# Patient Record
Sex: Male | Born: 1960 | Race: White | Hispanic: No | Marital: Married | Smoking: Never smoker
Health system: Southern US, Community
[De-identification: ages and names within clinical notes are randomized; demographics above are authoritative.]

## PROBLEM LIST (undated history)

## (undated) DIAGNOSIS — D34 Benign neoplasm of thyroid gland: Secondary | ICD-10-CM

## (undated) DIAGNOSIS — R091 Pleurisy: Secondary | ICD-10-CM

## (undated) DIAGNOSIS — R079 Chest pain, unspecified: Secondary | ICD-10-CM

## (undated) DIAGNOSIS — M5136 Other intervertebral disc degeneration, lumbar region: Secondary | ICD-10-CM

## (undated) DIAGNOSIS — N529 Male erectile dysfunction, unspecified: Secondary | ICD-10-CM

## (undated) DIAGNOSIS — R61 Generalized hyperhidrosis: Secondary | ICD-10-CM

## (undated) DIAGNOSIS — I493 Ventricular premature depolarization: Secondary | ICD-10-CM

## (undated) DIAGNOSIS — E785 Hyperlipidemia, unspecified: Secondary | ICD-10-CM

## (undated) DIAGNOSIS — Z8249 Family history of ischemic heart disease and other diseases of the circulatory system: Secondary | ICD-10-CM

## (undated) DIAGNOSIS — J309 Allergic rhinitis, unspecified: Secondary | ICD-10-CM

## (undated) DIAGNOSIS — I10 Essential (primary) hypertension: Secondary | ICD-10-CM

## (undated) DIAGNOSIS — R002 Palpitations: Secondary | ICD-10-CM

## (undated) HISTORY — DX: Family history of ischemic heart disease and other diseases of the circulatory system: Z82.49

## (undated) HISTORY — PX: ROTATOR CUFF REPAIR: SHX139

## (undated) HISTORY — DX: Chest pain, unspecified: R07.9

## (undated) HISTORY — DX: Ventricular premature depolarization: I49.3

## (undated) HISTORY — DX: Male erectile dysfunction, unspecified: N52.9

## (undated) HISTORY — DX: Pleurisy: R09.1

## (undated) HISTORY — DX: Other intervertebral disc degeneration, lumbar region: M51.36

## (undated) HISTORY — DX: Hyperlipidemia, unspecified: E78.5

## (undated) HISTORY — DX: Allergic rhinitis, unspecified: J30.9

## (undated) HISTORY — DX: Benign neoplasm of thyroid gland: D34

## (undated) HISTORY — PX: THYROIDECTOMY, PARTIAL: SHX18

## (undated) HISTORY — PX: TESTICLE TORSION REDUCTION: SHX795

## (undated) HISTORY — DX: Essential (primary) hypertension: I10

## (undated) HISTORY — PX: ANKLE ARTHROSCOPY: SUR85

## (undated) HISTORY — DX: Palpitations: R00.2

## (undated) HISTORY — DX: Generalized hyperhidrosis: R61

---

## 2002-10-31 ENCOUNTER — Ambulatory Visit (HOSPITAL_BASED_OUTPATIENT_CLINIC_OR_DEPARTMENT_OTHER): Admission: RE | Admit: 2002-10-31 | Discharge: 2002-10-31 | Payer: Self-pay | Admitting: Orthopedic Surgery

## 2005-07-06 ENCOUNTER — Encounter: Admission: RE | Admit: 2005-07-06 | Discharge: 2005-07-06 | Payer: Self-pay | Admitting: Internal Medicine

## 2005-07-09 ENCOUNTER — Other Ambulatory Visit: Admission: RE | Admit: 2005-07-09 | Discharge: 2005-07-09 | Payer: Self-pay | Admitting: Interventional Radiology

## 2005-07-09 ENCOUNTER — Encounter (INDEPENDENT_AMBULATORY_CARE_PROVIDER_SITE_OTHER): Payer: Self-pay | Admitting: Specialist

## 2005-07-09 ENCOUNTER — Encounter: Admission: RE | Admit: 2005-07-09 | Discharge: 2005-07-09 | Payer: Self-pay | Admitting: Internal Medicine

## 2005-07-20 ENCOUNTER — Encounter (HOSPITAL_COMMUNITY): Admission: RE | Admit: 2005-07-20 | Discharge: 2005-09-16 | Payer: Self-pay | Admitting: Internal Medicine

## 2005-10-15 ENCOUNTER — Encounter (INDEPENDENT_AMBULATORY_CARE_PROVIDER_SITE_OTHER): Payer: Self-pay | Admitting: *Deleted

## 2005-10-15 ENCOUNTER — Ambulatory Visit (HOSPITAL_COMMUNITY): Admission: RE | Admit: 2005-10-15 | Discharge: 2005-10-16 | Payer: Self-pay | Admitting: Surgery

## 2007-06-21 ENCOUNTER — Ambulatory Visit (HOSPITAL_COMMUNITY): Admission: RE | Admit: 2007-06-21 | Discharge: 2007-06-21 | Payer: Self-pay | Admitting: Internal Medicine

## 2010-05-23 NOTE — Op Note (Signed)
NAME:  Randy Harrison, Randy Harrison NO.:  [id number]   MEDICAL RECORD NO.:  [id number]          PATIENT TYPE:  OIB   LOCATION:  5741                         FACILITY:  [facility]   PHYSICIAN:  Velora Heckler, MD      DATE OF BIRTH:  12/25/1960   DATE OF PROCEDURE:  10/15/2005  DATE OF DISCHARGE:  10/16/2005                                 OPERATIVE REPORT   PREOPERATIVE DIAGNOSIS:  Right thyroid nodule.   POSTOPERATIVE DIAGNOSIS:  Right thyroid nodule.   PROCEDURE:  Right thyroid lobectomy.   SURGEON:  Velora Heckler, MD, FACS   ASSISTANT:  Cyndia Bent, MD, FACS   ANESTHESIA:  General per Maren Beach, MD   ESTIMATED BLOOD LOSS:  Minimal.   PREPARATION:  Betadine.   COMPLICATIONS:  None.   INDICATIONS:  The patient is a 50 year old white male from [place], [place].  The patient was found on routine physical exam June2007 to have a  dominant right thyroid mass.  The TSH level was normal.  Nuclear scan showed  this to be a dominant cold nodule.  Ultrasound showed a 3.2 cm nodule in the  right thyroid lobe.  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  The patient was referred for resection.   BODY OF REPORT:  The procedure is done in OR #16 at the [facility].  The patient is brought to the operating room, placed in  a supine position on the operating room table.  Following administration of  general anesthesia, the patient is positioned and then prepped and draped in  the usual strict aseptic fashion.  After ascertaining that an adequate level  of anesthesia had been obtained, a Kocher incision was made with a #15  blade.  Dissection was carried through subcutaneous tissues and platysma.  Hemostasis was obtained with the electrocautery.  Skin flaps are elevated  cephalad and caudad from the thyroid notch to the sternal notch.  A Mahorner  self-retaining retractor is placed for exposure.  Strap muscles  are incised  in the midline.  The left thyroid lobe is exposed.  On palpation it is  grossly normal.  There are no dominant masses.  There is no lymphadenopathy.   Next we turned our attention to the right thyroid lobe.  Again strap muscles  are mobilized and reflected laterally.  Middle thyroid vein is divided  between Ligaclips.  There is a large mass in the central portion of the  right lobe which occupies a large portion of the lobe.  It appears complex  with both cystic and solid components.  Superior pole vessels are mobilized,  dissected out, ligated in continuity with 2-0 silk ties and medium  Ligaclips, and divided.  Gland is rolled anteriorly.  Superior parathyroid  gland is identified and preserved on its vascular pedicle.  Branches of the  inferior thyroid artery are divided between small Ligaclips.  Inferior  venous tributaries are divided between medium Ligaclips.  Gland is rolled  further anteriorly.  Venous tributaries to the isthmus are ligated in  continuity with 2-0 silk ties and divided.  Ligament of Allyson Sabal is transected  with the electrocautery and the gland is rolled anteriorly.  Inferior  parathyroid tissue is identified and preserved.  Gland is rolled up and onto  the trachea.  It is mobilized across the isthmus.  The isthmus is transected  to the left of midline between hemostats and suture ligated with 3-0 Vicryl  suture ligatures.  Right thyroid lobe is then submitted to pathology.  Berneta Levins, M.D., performed frozen section and believes this to be a  hyperplastic thyroid nodule.  She sees no evidence of malignancy on frozen  section.   Right neck is irrigated with warm saline.  Good hemostasis is achieved.  Surgicel is placed over the area of the recurrent laryngeal nerve and  parathyroid glands.  Strap muscles are reapproximated in the midline with  interrupted 3-0 Vicryl sutures.  Platysma is closed with interrupted 3-0  Vicryl sutures.  Skin is closed with  a running 4-0 Vicryl subcuticular  suture.  Wound is washed and dried and Benzoin and Steri-Strips are applied  and sterile dressings were applied.  The patient is awakened from anesthesia  and brought to the recovery room in stable condition.  The patient tolerated  the procedure well.      Velora Heckler, MD  Electronically Signed     TMG/MEDQ  D:  10/15/2005  T:  10/17/2005  Job:  [id number]   cc:   Theressa Millard, M.D.

## 2010-05-23 NOTE — Op Note (Signed)
NAME:  Randy Harrison, SEHGAL                          ACCOUNT NO.:  [id number]   MEDICAL RECORD NO.:  [id number]                   PATIENT TYPE:  AMB   LOCATION:  DSC                                  FACILITY:  [facility]   PHYSICIAN:  Deidre Ala, M.D.                 DATE OF BIRTH:  1960-08-19   DATE OF PROCEDURE:  10/31/2002  DATE OF DISCHARGE:                                 OPERATIVE REPORT   PREOPERATIVE DIAGNOSIS:  1. 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  2. Acromioclavicular joint arthritis.  3. Rule out partial thickness rotator cuff tear.   POSTOPERATIVE DIAGNOSIS:  1. Impingement with type 3 acromion.  2. Acromioclavicular joint arthritis.  3. Rotator cuff avulsion off tuberosity.  4. Subdeltoid bursitis.  5. Minor synovitis, anterior triangle.   OPERATION PERFORMED:  1. Left shoulder operative arthroscopy with subacromial arch decompression     acromioplasty.  2. Mini open rotator cuff repair with three Mitek anchors.  3. Arthroscopic distal clavicle resection.  4. Debridement synovitis, glenohumeral joint and subdeltoid bursectomy.   SURGEON:  Bradley Ferris, M.D.   ASSISTANT:  Madilyn Fireman, P.A.-C.   ANESTHESIA:  General endotracheal.   CULTURES:  None.   DRAINS:  None.   ESTIMATED BLOOD LOSS:  Minimal.   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  We have seen him now for acromioclavicular joint  arthritis and signs of impingement with overhead arm use from May 27, 2001,  approximately 17 or 18 months ago.  We have injected him in the  acromioclavicular joint, we injected subacromial.  He had a type 2 to 3  acromion.  I think it was actually a fairly sharp acromion and we injected  him again January 23, 2002.  We injected again February 13, 2002 and he  continued to have on and off discomfort and finally came back on October 27, 2002 with persistent pain and wanted to proceed with  surgical intervention.  We did not do an MRI scan.  At surgery, he had some anterior synovitis in  the triangle.  Biceps was intact.  Glenohumeral surfaces looked good.  There  was no significant fraying of the superior labrum and no SLAP lesion.  He  had obvious bursitis in the subacromial space with a fairly sharp anterior  acromion, obviously arthritic acromioclavicular joint with a torn  acromioclavicular meniscus and he did have a rotator cuff tear measuring  about 2 cm avulsed up from the tuberosity that we anchored down by three  super Mitek anchors in a V-shaped fashion with fresh bone with a watertight  closure with  #2 Ethibond sutures.   DESCRIPTION OF PROCEDURE:  With adequate anesthesia obtained using  endotracheal technique, 1g Ancef given IV prophylaxis, the patient was  placed in the supine beach chair position.  The left shoulder was prepped  and draped in standard fashion.  After standard prepping and draping, skin  markers were made for anatomic positioning.  20mL of 0.5% Marcaine with  epinephrine was injected in the subacromial space to open it up.  I then  entered the shoulder from a posterior portal.  Anterior portal was  established just lateral to the coracoid.  I then smoothed and shaved the  anterior triangle, reversed portals and did similar shaving superolateral.  I then entered the subacromial space with the posterior portal.  The  anterolateral portal was established and another one just a bit more  posterior later to achieve better angle.  I then debrided soft tissue from  the anterior undersurface of the acromion. I then used the ablator to  cauterize and shaver to smooth further and then brought in a 6.0 bur and  completed acromioplasty to the roof of the subacromial space in the manner  of Caspari.  I then turned the scope sideways and through the anterior  portal with a basket, debrided the acromioclavicular meniscus, further  shaved with a shaver.  I  then used the 6.0 bur to complete distal clavicle  resection two shaverbreadths in.  The edges were then smoothed with a shaver  and ablator.  I then looked to the shoulder from an anterolateral portal,  shaved the undersurface of the acromion from the side back to the bicortical  bone in the manner of Caspari and smoothed additionally, the distal  clavicle.  I then inspected the rotator cuff with internal external rotation  in neutral, shaved out the bursa, smoothed with an ablator but found the  rotator cuff tear.  I then painted the shoulder with Betadine and made an  incision from the anterior acromion to the anterolateral portal for a mini-  incision.  The incision was deepened sharply with a knife and hemostasis  obtained using a Bovie electrocoagulator.  Dissection was carried down  through the anterior fibers of the deltoid and retractors were placed.  I  then identified the rotator cuff tear.  I used a bur to make a V-shape in  the cortical bone at the tuberosity to freshen it.  I then placed three four  pronged super Mitek anchors and brought horizontal mattress sutures from  under the rotator cuff on top with #2 Ethibond attached to the Miteks and  pulled the rotator cuff down to this bony bed to seal it down and repair it.  I then brought the tails of the sutures out through the soft tissues further  lateral on the tuberosities and buried the knots.  This created a watertight  closure and anchor of the rotator cuff back down.  Irrigation was carried  out.  The wound was then closed in layers with running locking #1 Vicryl on  the deltoid muscle and fascia, 2-0 and 3-0 Vicryl on the subcu and a running  4-0 nylon.  4-0 nylon was placed in the posterolateral portal.  A bulky  sterile compressive dressing was applied with sling.  The patient having  tolerated the procedure well was awakened and taken to the recovery room in satisfactory condition to be discharged per outpatient  routine, given  Percocet for pain and told to call the office for appointment for recheck  tomorrow.  Deidre Ala, M.D.    VEP/MEDQ  D:  10/31/2002  T:  10/31/2002  Job:  [id number]

## 2012-09-19 ENCOUNTER — Ambulatory Visit (INDEPENDENT_AMBULATORY_CARE_PROVIDER_SITE_OTHER): Payer: BC Managed Care – PPO | Admitting: Neurology

## 2012-09-19 ENCOUNTER — Ambulatory Visit (INDEPENDENT_AMBULATORY_CARE_PROVIDER_SITE_OTHER): Payer: Self-pay

## 2012-09-19 DIAGNOSIS — R202 Paresthesia of skin: Secondary | ICD-10-CM | POA: Insufficient documentation

## 2012-09-19 DIAGNOSIS — R209 Unspecified disturbances of skin sensation: Secondary | ICD-10-CM

## 2012-09-19 NOTE — Procedures (Signed)
    [facility]  NCS (NERVE CONDUCTION STUDY) WITH EMG (ELECTROMYOGRAPHY) REPORT   STUDY DATE: 09/19/2012 PATIENT NAME: Randy Harrison DOB: [birth year] MRN: [id number]    TECHNOLOGIST: Judithann Sheen ELECTROMYOGRAPHER: Levert Feinstein M.D.  CLINICAL INFORMATION:  52 years old right-handed Caucasian male, with six-month history of neck pain, radiating pain to right shoulder, bilateral second and third fingers  FINDINGS: NERVE CONDUCTION STUDY: Bilateral median, ulnar sensory and motor responses were normal  NEEDLE ELECTROMYOGRAPHY: Selected needle examination was performed at right upper extremity, and right cervical paraspinal muscles.  Needle examination of right extensor digital communis, biceps, triceps, pronator teres, deltoid was normal.  There was no spontaneous activity at the right cervical paraspinal muscles, right C6, C5, C7,  IMPRESSION:   This is a normal study. There is no electrodiagnostic evidence of bilateral upper extremity neuropathy, or right cervical radiculopathy   INTERPRETING PHYSICIAN:   Levert Feinstein M.D. Ph.D. [facility] [address] [place], [place] [address] [phone]

## 2013-03-09 ENCOUNTER — Other Ambulatory Visit: Payer: Self-pay | Admitting: Gastroenterology

## 2013-03-31 ENCOUNTER — Encounter: Payer: Self-pay | Admitting: *Deleted

## 2013-08-02 ENCOUNTER — Encounter (HOSPITAL_COMMUNITY): Payer: Self-pay | Admitting: *Deleted

## 2013-08-02 ENCOUNTER — Ambulatory Visit (INDEPENDENT_AMBULATORY_CARE_PROVIDER_SITE_OTHER): Payer: BC Managed Care – PPO | Admitting: Cardiovascular Disease

## 2013-08-02 ENCOUNTER — Encounter: Payer: Self-pay | Admitting: Cardiovascular Disease

## 2013-08-02 ENCOUNTER — Telehealth (HOSPITAL_COMMUNITY): Payer: Self-pay

## 2013-08-02 VITALS — BP 148/92 | HR 69 | Ht 74.0 in | Wt 250.9 lb

## 2013-08-02 DIAGNOSIS — E785 Hyperlipidemia, unspecified: Secondary | ICD-10-CM | POA: Insufficient documentation

## 2013-08-02 DIAGNOSIS — R55 Syncope and collapse: Secondary | ICD-10-CM | POA: Diagnosis not present

## 2013-08-02 DIAGNOSIS — R079 Chest pain, unspecified: Secondary | ICD-10-CM | POA: Diagnosis not present

## 2013-08-02 DIAGNOSIS — I493 Ventricular premature depolarization: Secondary | ICD-10-CM | POA: Insufficient documentation

## 2013-08-02 DIAGNOSIS — R42 Dizziness and giddiness: Secondary | ICD-10-CM

## 2013-08-02 DIAGNOSIS — R002 Palpitations: Secondary | ICD-10-CM | POA: Diagnosis not present

## 2013-08-02 DIAGNOSIS — I4891 Unspecified atrial fibrillation: Secondary | ICD-10-CM

## 2013-08-02 DIAGNOSIS — I10 Essential (primary) hypertension: Secondary | ICD-10-CM | POA: Diagnosis not present

## 2013-08-02 DIAGNOSIS — I209 Angina pectoris, unspecified: Secondary | ICD-10-CM

## 2013-08-02 NOTE — Assessment & Plan Note (Signed)
Controlled on current medications 

## 2013-08-02 NOTE — Assessment & Plan Note (Signed)
The patient is a one month history of palpitations that occur daily basis. There no provoking factors. They last for seconds or minutes at a time. They are associated with shortness of breath and occasional dizziness and a feeling of blurred vision. She he saw his primary care physician who did an EKG and noted PVCs. I'm going to get a bone the monitor to further evaluate. I also going to get a 2-D echocardiogram

## 2013-08-02 NOTE — Progress Notes (Signed)
08/02/2013 Guy Franco   31-Dec-1960  [id number]  Primary Physician Horton Finer, MD Primary Cardiologist: Lorretta Harp MD Renae Gloss   HPI:  Mr. Chadderdon is a 53 year old mildly overweight married Caucasian male with no children and is accompanied today by his wife Pamala Hurry. He is referred by [facility] medical (Dr. Nehemiah Settle) for evaluation of palpitations, PVCs and chest pain. The patient's cardiovascular cytopathologist remarkable for treated hypertension and hyperlipidemia. He does have a family history of heart disease with a father who had a myocardial infarction and bypass surgery in his 59s. He has never had a heart attack or stroke. Over the last month he's noticed palpitations occurring on a daily basis with associated occasional chest doing to his back and left upper extremity. He does also has increasing shortness of breath and presyncope.   Current Outpatient Prescriptions  Medication Sig Dispense Refill  . aluminum chloride (DRYSOL) 20 % external solution Apply topically at bedtime.      Marland Kitchen atorvastatin (LIPITOR) 20 MG tablet Take 20 mg by mouth daily.      . Cholecalciferol (VITAMIN D) 2000 UNITS CAPS Take by mouth.      Marland Kitchen lisinopril (PRINIVIL,ZESTRIL) 10 MG tablet Take by mouth daily.      Marland Kitchen loratadine (CLARITIN) 10 MG tablet Take 10 mg by mouth daily.      . MULTIPLE VITAMIN PO Take by mouth.      . vitamin A 10000 UNIT capsule Take 10,000 Units by mouth every other day.      . vitamin E 400 UNIT capsule Take 400 Units by mouth daily.       No current facility-administered medications for this visit.    Allergies  Allergen Reactions  . Asa [Aspirin] Hives  . Codeine   . Ibuprofen   . Monosodium Glutamate   . Penicillins   . Yellow Dyes (Non-Tartrazine)     History   Social History  . Marital Status: Married    Spouse Name: N/A    Number of Children: N/A  . Years of Education: N/A   Occupational History  . Not on file.   Social  History Main Topics  . Smoking status: Never Smoker   . Smokeless tobacco: Never Used  . Alcohol Use: No  . Drug Use: No  . Sexual Activity: Not on file   Other Topics Concern  . Not on file   Social History Narrative  . No narrative on file     Review of Systems: General: negative for chills, fever, night sweats or weight changes.  Cardiovascular: negative for chest pain, dyspnea on exertion, edema, orthopnea, palpitations, paroxysmal nocturnal dyspnea or shortness of breath Dermatological: negative for rash Respiratory: negative for cough or wheezing Urologic: negative for hematuria Abdominal: negative for nausea, vomiting, diarrhea, bright red blood per rectum, melena, or hematemesis Neurologic: negative for visual changes, syncope, or dizziness All other systems reviewed and are otherwise negative except as noted above.    Blood pressure 148/92, pulse 69, height 6\' 2"  (1.88 m), weight 250 lb 14.4 oz (113.807 kg).  General appearance: alert and no distress Neck: no adenopathy, no carotid bruit, no JVD, supple, symmetrical, trachea midline and thyroid not enlarged, symmetric, no tenderness/mass/nodules Lungs: clear to auscultation bilaterally Heart: regular rate and rhythm, S1, S2 normal, no murmur, click, rub or gallop Extremities: extremities normal, atraumatic, no cyanosis or edema and 2+ pedal pulses bilaterally  EKG normal sinus rhythm at 69 without ST or T wave  changes  ASSESSMENT AND PLAN:   Palpitations The patient is a one month history of palpitations that occur daily basis. There no provoking factors. They last for seconds or minutes at a time. They are associated with shortness of breath and occasional dizziness and a feeling of blurred vision. She he saw his primary care physician who did an EKG and noted PVCs. I'm going to get a bone the monitor to further evaluate. I also going to get a 2-D echocardiogram  Hyperlipidemia On statin therapy followed by his  PCP  Essential hypertension Controlled on current medications  Chest pain The patient does have positive crepitus factors including hyperlipidemia, hypertension and family history with a father who had a myocardial infarction in his 51s. Over the last month he's had palpitations probably related to PVCs as well as chest pain with radiation to his back and left upper extremity. I'm going to get an exercise Myoview stress test to risk stratify him and rule out an ischemic etiology.      Lorretta Harp MD FACP,FACC,FAHA, [facility] 08/02/2013 12:02 PM

## 2013-08-02 NOTE — Patient Instructions (Signed)
Your physician has requested that you have an echocardiogram. Echocardiography is a painless test that uses sound waves to create images of your heart. It provides your doctor with information about the size and shape of your heart and how well your heart's chambers and valves are working. This procedure takes approximately one hour. There are no restrictions for this procedure.  Your physician has recommended that you wear an event monitor. Event monitors are medical devices that record the heart's electrical activity. Doctors most often [place] these monitors to diagnose arrhythmias. Arrhythmias are problems with the speed or rhythm of the heartbeat. The monitor is a small, portable device. You can wear one while you do your normal daily activities. This is usually used to diagnose what is causing palpitations/syncope (passing out).  Your physician has requested that you have en exercise stress myoview. For further information please visit [email or web address]. Please follow instruction sheet, as given.  Your physician recommends that you schedule a follow-up appointment with Dr.Berry after all test and monitor is completed

## 2013-08-02 NOTE — Assessment & Plan Note (Signed)
The patient does have positive crepitus factors including hyperlipidemia, hypertension and family history with a father who had a myocardial infarction in his 5s. Over the last month he's had palpitations probably related to PVCs as well as chest pain with radiation to his back and left upper extremity. I'm going to get an exercise Myoview stress test to risk stratify him and rule out an ischemic etiology.

## 2013-08-02 NOTE — Assessment & Plan Note (Signed)
On statin therapy followed by his PCP 

## 2013-08-02 NOTE — Telephone Encounter (Signed)
Encounter complete. 

## 2013-08-03 ENCOUNTER — Ambulatory Visit (HOSPITAL_COMMUNITY)
Admission: RE | Admit: 2013-08-03 | Discharge: 2013-08-03 | Disposition: A | Discharge status: Home / Self Care | Payer: BC Managed Care – PPO | Source: Ambulatory Visit | Attending: Cardiology | Admitting: Cardiology

## 2013-08-03 DIAGNOSIS — Z8249 Family history of ischemic heart disease and other diseases of the circulatory system: Secondary | ICD-10-CM | POA: Insufficient documentation

## 2013-08-03 DIAGNOSIS — R0989 Other specified symptoms and signs involving the circulatory and respiratory systems: Secondary | ICD-10-CM | POA: Insufficient documentation

## 2013-08-03 DIAGNOSIS — R0609 Other forms of dyspnea: Secondary | ICD-10-CM | POA: Insufficient documentation

## 2013-08-03 DIAGNOSIS — R002 Palpitations: Secondary | ICD-10-CM | POA: Insufficient documentation

## 2013-08-03 DIAGNOSIS — R079 Chest pain, unspecified: Secondary | ICD-10-CM | POA: Insufficient documentation

## 2013-08-03 MED ORDER — TECHNETIUM TC 99M SESTAMIBI GENERIC - CARDIOLITE
30.0000 | Freq: Once | INTRAVENOUS | Status: AC | PRN
  Administered 2013-08-03: 30 via INTRAVENOUS

## 2013-08-03 MED ORDER — TECHNETIUM TC 99M SESTAMIBI GENERIC - CARDIOLITE
10.0000 | Freq: Once | INTRAVENOUS | Status: AC | PRN
  Administered 2013-08-03: 10 via INTRAVENOUS

## 2013-08-03 NOTE — Procedures (Addendum)
[facility] [address] [place] [address] [phone]  Cardiology Nuclear Med Study  HRIDAY STAI is a 53 y.o. male     MRN : [id number]     DOB: January 11, 1960  Procedure Date: 08/03/2013  Nuclear Med Background Indication for Stress Test:  Evaluation for Ischemia History:  No prior cardiac or respiratory history reported;No prior NUC MPI for comparison. Cardiac Risk Factors: Family History - CAD, Hypertension, Lipids and Obesity  Symptoms:  Chest Pain, Dizziness, DOE, Fatigue, Light-Headedness, Near Syncope, Palpitations and SOB   Nuclear Pre-Procedure Caffeine/Decaff Intake:  7:00pm NPO After: 5:00am   IV Site: R Forearm  IV 0.9% NS with Angio Cath:  22g  Chest Size (in):  48"  IV Started by: Rolene Course, RN  Height: 6\' 2"  (1.88 m)  Cup Size: n/a  BMI:  Body mass index is 32.08 kg/(m^2). Weight:  250 lb (113.399 kg)   Tech Comments:  n/a    Nuclear Med Study 1 or 2 day study: 1 day  Stress Test Type:  Stress  Order Authorizing Provider:  Quay Burow, MD   Resting Radionuclide: Technetium 28m Sestamibi  Resting Radionuclide Dose: 10.5 mCi   Stress Radionuclide:  Technetium 74m Sestamibi  Stress Radionuclide Dose: 29.0 mCi           Stress Protocol Rest HR:78 Stress HR: 171  Rest BP: 132/92 Stress BP: 214/102  Exercise Time (min): 8:30 METS: 10.1   Predicted Max HR: 169 bpm % Max HR: 101.18 bpm Rate Pressure Product: 36629  Dose of Adenosine (mg):  n/a Dose of Lexiscan: n/a mg  Dose of Atropine (mg): n/a Dose of Dobutamine: n/a mcg/kg/min (at max HR)  Stress Test Technologist: Leane Para, CCT Nuclear Technologist: Otho Perl, CNMT   Rest Procedure:  Myocardial perfusion imaging was performed at rest 45 minutes following the intravenous administration of Technetium 79m Sestamibi. Stress Procedure:  The patient performed treadmill exercise using a Bruce  Protocol for 8:30 minutes. The patient  stopped due to SOB, Chest tightness and Fatigue and denied any chest pain.  There were no significant ST-T wave changes.  Technetium 25m Sestamibi was injected IV at peak exercise and myocardial perfusion imaging was performed after a brief delay.  Transient Ischemic Dilatation (Normal <1.22):  0.74   QGS EDV:  121 ml QGS ESV:  43 ml LV Ejection Fraction: 65%  Rest ECG: NSR - Normal EKG  Stress ECG: No significant change from baseline ECG  QPS Raw Data Images:  Normal; no motion artifact; normal heart/lung ratio. Stress Images:  Normal homogeneous uptake in all areas of the myocardium. Rest Images:  There is decreased uptake in the apex. Subtraction (SDS):  No evidence of ischemia.  Impression Exercise Capacity:  Good exercise capacity. BP Response:  Hypertensive blood pressure response. Clinical Symptoms:  Chest tightness, but had been present before starting the test ECG Impression:  No significant ST segment change suggestive of ischemia. Comparison with Prior Nuclear Study: No previous nuclear study performed  Overall Impression:  Normal stress nuclear study.  LV Wall Motion:  NL LV Function; NL Wall Motion; EF 65%  Pixie Casino, MD, [facility] Board Certified in Nuclear Cardiology Attending Cardiologist [place], MD  08/03/2013 12:53 PM

## 2013-08-07 ENCOUNTER — Encounter: Payer: Self-pay | Admitting: *Deleted

## 2013-08-09 ENCOUNTER — Ambulatory Visit (HOSPITAL_COMMUNITY)
Admission: RE | Admit: 2013-08-09 | Discharge: 2013-08-09 | Disposition: A | Discharge status: Home / Self Care | Payer: BC Managed Care – PPO | Source: Ambulatory Visit | Attending: Cardiology | Admitting: Cardiology

## 2013-08-09 ENCOUNTER — Telehealth: Payer: Self-pay | Admitting: Cardiovascular Disease

## 2013-08-09 DIAGNOSIS — R002 Palpitations: Secondary | ICD-10-CM

## 2013-08-09 DIAGNOSIS — R079 Chest pain, unspecified: Secondary | ICD-10-CM

## 2013-08-09 DIAGNOSIS — I4949 Other premature depolarization: Secondary | ICD-10-CM | POA: Insufficient documentation

## 2013-08-09 DIAGNOSIS — I359 Nonrheumatic aortic valve disorder, unspecified: Secondary | ICD-10-CM

## 2013-08-09 NOTE — Telephone Encounter (Signed)
Checking BP on a regular basis which has been normal.  HR readings in the 50's and 60's and sometimes reads "low" and doesn't give him a number.  Instructed how to take a manual HR and will do this when he gets a "low" reading. Will bring his readings in for his next office visit.  Patient voiced understanding.

## 2013-08-09 NOTE — Progress Notes (Signed)
2D Echo Performed 08/09/2013    Marygrace Drought, RCS

## 2013-08-09 NOTE — Telephone Encounter (Signed)
Patient was here today for his Echo.  He wanted to let Dr. Gwenlyn Found know that he has  Been checking his blood pressure at home and so far it is normal.  Sometimes, however he feels weird and when this happens, his pulse is low.  Just wanted to make you aware.

## 2013-09-15 ENCOUNTER — Encounter: Payer: Self-pay | Admitting: Cardiovascular Disease

## 2013-09-15 ENCOUNTER — Ambulatory Visit (INDEPENDENT_AMBULATORY_CARE_PROVIDER_SITE_OTHER): Payer: BC Managed Care – PPO | Admitting: Cardiovascular Disease

## 2013-09-15 VITALS — BP 141/102 | HR 78 | Ht 74.0 in | Wt 254.4 lb

## 2013-09-15 DIAGNOSIS — I10 Essential (primary) hypertension: Secondary | ICD-10-CM

## 2013-09-15 DIAGNOSIS — E785 Hyperlipidemia, unspecified: Secondary | ICD-10-CM

## 2013-09-15 DIAGNOSIS — I4949 Other premature depolarization: Secondary | ICD-10-CM

## 2013-09-15 DIAGNOSIS — I493 Ventricular premature depolarization: Secondary | ICD-10-CM

## 2013-09-15 MED ORDER — LOSARTAN POTASSIUM 50 MG PO TABS: 50.0000 mg | ORAL_TABLET | Freq: Every day | ORAL | Status: DC

## 2013-09-15 NOTE — Assessment & Plan Note (Signed)
His blood pressure is elevated today on 10 mg of lisinopril. He does complain of an dry cough. I'm going to transition him to losartan 50 mg a day.

## 2013-09-15 NOTE — Assessment & Plan Note (Signed)
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 Apparently his thyroid functions were tested by his primary care physician. He does take an allergy medicine at night not sure whether has pseudoephedrine in it. He also complains of a dry cough. [place] this beta blocker which he does not wish to be prolonged at this time. I have reassured him.

## 2013-09-15 NOTE — Progress Notes (Signed)
09/15/2013 Randy Harrison   [birth year]  [id number]  Primary Physician Randy Finer, MD Primary Cardiologist: Randy Harp MD Randy Harrison   HPI:  Randy Harrison is a 53 year old mildly overweight married Caucasian male with no children and is accompanied today by his wife Randy Harrison. He was referred by [facility] medical (Dr. Nehemiah Harrison) for evaluation of palpitations, PVCs and chest pain. The patient's cardiovascular risk factors are remarkable for treated hypertension and hyperlipidemia. He does have a family history of heart disease with a father who had a myocardial infarction and bypass surgery in his 36s. He has never had a heart attack or stroke. Over the last month he's noticed palpitations occurring on a daily basis with associated occasional chest doing to his back and left upper extremity. He does also has increasing shortness of breath and presyncope. I ordered a Myoview stress test which was entirely normal as was a 2-D echocardiogram.An event  Monitor showed unifocal PVCs.    Current Outpatient Prescriptions  Medication Sig Dispense Refill  . aluminum chloride (DRYSOL) 20 % external solution Apply topically at bedtime.      Marland Kitchen atorvastatin (LIPITOR) 20 MG tablet Take 20 mg by mouth daily.      . Cholecalciferol (VITAMIN D) 2000 UNITS CAPS Take by mouth.      . loratadine (CLARITIN) 10 MG tablet Take 10 mg by mouth daily.      . MULTIPLE VITAMIN PO Take by mouth.      . vitamin A 10000 UNIT capsule Take 10,000 Units by mouth every other day.      . vitamin E 400 UNIT capsule Take 400 Units by mouth daily.      Marland Kitchen losartan (COZAAR) 50 MG tablet Take 1 tablet (50 mg total) by mouth daily.  90 tablet  3   No current facility-administered medications for this visit.    Allergies  Allergen Reactions  . Asa [Aspirin] Hives  . Codeine   . Ibuprofen   . Monosodium Glutamate   . Penicillins   . Yellow Dyes (Non-Tartrazine)     History   Social History  .  Marital Status: Married    Spouse Name: N/A    Number of Children: N/A  . Years of Education: N/A   Occupational History  . Not on file.   Social History Main Topics  . Smoking status: Never Smoker   . Smokeless tobacco: Never Used  . Alcohol Use: No  . Drug Use: No  . Sexual Activity: Not on file   Other Topics Concern  . Not on file   Social History Narrative  . No narrative on file     Review of Systems: General: negative for chills, fever, night sweats or weight changes.  Cardiovascular: negative for chest pain, dyspnea on exertion, edema, orthopnea, palpitations, paroxysmal nocturnal dyspnea or shortness of breath Dermatological: negative for rash Respiratory: negative for cough or wheezing Urologic: negative for hematuria Abdominal: negative for nausea, vomiting, diarrhea, bright red blood per rectum, melena, or hematemesis Neurologic: negative for visual changes, syncope, or dizziness All other systems reviewed and are otherwise negative except as noted above.    Blood pressure 141/102, pulse 78, height 6\' 2"  (1.88 m), weight 254 lb 6.4 oz (115.395 kg).  General appearance: alert and no distress Neck: no adenopathy, no carotid bruit, no JVD, supple, symmetrical, trachea midline and thyroid not enlarged, symmetric, no tenderness/mass/nodules Lungs: clear to auscultation bilaterally Heart: regular rate and rhythm, S1, S2 normal, no  murmur, click, rub or gallop Extremities: extremities normal, atraumatic, no cyanosis or edema  EKG not performed today  ASSESSMENT AND PLAN:   PVC's (premature ventricular contractions) Patient had a 30 day event monitor which showed unifocal PVCs. Apparently his thyroid functions were tested by his primary care physician. He does take an allergy medicine at night not sure whether has pseudoephedrine in it. He also complains of a dry cough. [place] this beta blocker which he does not wish to be prolonged at this time. I have reassured  him.  Hyperlipidemia On statin therapy followed by his PCP  Chest pain He will have chest pain. A Myoview stress test was entirely normal as was a 2-D echocardiogram  Essential hypertension His blood pressure is elevated today on 10 mg of lisinopril. He does complain of an dry cough. I'm going to transition him to losartan 50 mg a day.      Randy Harp MD FACP,FACC,FAHA, [facility] 09/15/2013 10:18 AM

## 2013-09-15 NOTE — Assessment & Plan Note (Signed)
He will have chest pain. A Myoview stress test was entirely normal as was a 2-D echocardiogram

## 2013-09-15 NOTE — Patient Instructions (Signed)
Your physician recommends that you schedule a follow-up appointment in: 3 Months with Dr.Berry  STOP your Lisinopril BEGIN Losartan 50mg  Daily

## 2013-09-15 NOTE — Assessment & Plan Note (Signed)
On statin therapy followed by his PCP 

## 2013-12-19 ENCOUNTER — Ambulatory Visit: Payer: BC Managed Care – PPO | Admitting: Cardiovascular Disease

## 2014-12-05 ENCOUNTER — Other Ambulatory Visit: Payer: Self-pay

## 2014-12-05 MED ORDER — LOSARTAN POTASSIUM 50 MG PO TABS: 50.0000 mg | ORAL_TABLET | Freq: Every day | ORAL | Status: AC

## 2014-12-05 NOTE — Telephone Encounter (Signed)
Rx has been sent to pharmacy for refill 

## 2015-03-25 ENCOUNTER — Other Ambulatory Visit: Payer: Self-pay | Admitting: Internal Medicine

## 2015-03-25 DIAGNOSIS — N63 Unspecified lump in unspecified breast: Secondary | ICD-10-CM

## 2015-03-28 ENCOUNTER — Ambulatory Visit
Admission: RE | Admit: 2015-03-28 | Discharge: 2015-03-28 | Disposition: A | Discharge status: Home / Self Care | Payer: BLUE CROSS/BLUE SHIELD | Source: Ambulatory Visit | Attending: Internal Medicine | Admitting: Internal Medicine

## 2015-03-28 ENCOUNTER — Other Ambulatory Visit: Payer: Self-pay | Admitting: Internal Medicine

## 2015-03-28 DIAGNOSIS — N63 Unspecified lump in unspecified breast: Secondary | ICD-10-CM

## 2015-04-03 ENCOUNTER — Other Ambulatory Visit: Payer: Self-pay | Admitting: Internal Medicine

## 2015-04-03 ENCOUNTER — Ambulatory Visit
Admission: RE | Admit: 2015-04-03 | Discharge: 2015-04-03 | Disposition: A | Discharge status: Home / Self Care | Payer: BLUE CROSS/BLUE SHIELD | Source: Ambulatory Visit | Attending: Internal Medicine | Admitting: Internal Medicine

## 2015-04-03 DIAGNOSIS — N63 Unspecified lump in unspecified breast: Secondary | ICD-10-CM

## 2016-01-13 DIAGNOSIS — Z23 Encounter for immunization: Secondary | ICD-10-CM | POA: Diagnosis not present

## 2016-01-13 DIAGNOSIS — Z0000 Encounter for general adult medical examination without abnormal findings: Secondary | ICD-10-CM | POA: Diagnosis not present

## 2016-03-02 DIAGNOSIS — L821 Other seborrheic keratosis: Secondary | ICD-10-CM | POA: Diagnosis not present

## 2016-05-20 DIAGNOSIS — J0100 Acute maxillary sinusitis, unspecified: Secondary | ICD-10-CM | POA: Diagnosis not present

## 2016-08-03 DIAGNOSIS — I10 Essential (primary) hypertension: Secondary | ICD-10-CM | POA: Diagnosis not present

## 2016-08-03 DIAGNOSIS — E785 Hyperlipidemia, unspecified: Secondary | ICD-10-CM | POA: Diagnosis not present

## 2016-08-03 DIAGNOSIS — E041 Nontoxic single thyroid nodule: Secondary | ICD-10-CM | POA: Diagnosis not present

## 2016-09-30 DIAGNOSIS — M545 Low back pain: Secondary | ICD-10-CM | POA: Diagnosis not present

## 2016-09-30 DIAGNOSIS — G894 Chronic pain syndrome: Secondary | ICD-10-CM | POA: Diagnosis not present

## 2016-09-30 DIAGNOSIS — M5117 Intervertebral disc disorders with radiculopathy, lumbosacral region: Secondary | ICD-10-CM | POA: Diagnosis not present

## 2017-01-29 DIAGNOSIS — M67911 Unspecified disorder of synovium and tendon, right shoulder: Secondary | ICD-10-CM | POA: Diagnosis not present

## 2017-02-19 DIAGNOSIS — J069 Acute upper respiratory infection, unspecified: Secondary | ICD-10-CM | POA: Diagnosis not present

## 2017-02-19 DIAGNOSIS — J209 Acute bronchitis, unspecified: Secondary | ICD-10-CM | POA: Diagnosis not present

## 2017-02-21 DIAGNOSIS — J0100 Acute maxillary sinusitis, unspecified: Secondary | ICD-10-CM | POA: Diagnosis not present

## 2017-02-21 DIAGNOSIS — J0110 Acute frontal sinusitis, unspecified: Secondary | ICD-10-CM | POA: Diagnosis not present

## 2017-03-09 DIAGNOSIS — M25511 Pain in right shoulder: Secondary | ICD-10-CM | POA: Diagnosis not present

## 2017-03-11 DIAGNOSIS — M75112 Incomplete rotator cuff tear or rupture of left shoulder, not specified as traumatic: Secondary | ICD-10-CM | POA: Diagnosis not present

## 2017-03-17 DIAGNOSIS — M25511 Pain in right shoulder: Secondary | ICD-10-CM | POA: Diagnosis not present

## 2017-03-31 DIAGNOSIS — M25511 Pain in right shoulder: Secondary | ICD-10-CM | POA: Diagnosis not present

## 2017-04-02 ENCOUNTER — Encounter (HOSPITAL_COMMUNITY): Payer: Self-pay | Admitting: Emergency Medicine

## 2017-04-02 ENCOUNTER — Emergency Department (HOSPITAL_COMMUNITY): Payer: 59

## 2017-04-02 ENCOUNTER — Emergency Department (HOSPITAL_COMMUNITY)
Admission: EM | Admit: 2017-04-02 | Discharge: 2017-04-02 | Disposition: A | Discharge status: Home / Self Care | Payer: 59 | Attending: Emergency Medicine | Admitting: Emergency Medicine

## 2017-04-02 ENCOUNTER — Other Ambulatory Visit: Payer: Self-pay

## 2017-04-02 DIAGNOSIS — S20212A Contusion of left front wall of thorax, initial encounter: Secondary | ICD-10-CM | POA: Diagnosis not present

## 2017-04-02 DIAGNOSIS — S161XXA Strain of muscle, fascia and tendon at neck level, initial encounter: Secondary | ICD-10-CM | POA: Insufficient documentation

## 2017-04-02 DIAGNOSIS — R109 Unspecified abdominal pain: Secondary | ICD-10-CM | POA: Diagnosis not present

## 2017-04-02 DIAGNOSIS — Y998 Other external cause status: Secondary | ICD-10-CM | POA: Diagnosis not present

## 2017-04-02 DIAGNOSIS — S0990XA Unspecified injury of head, initial encounter: Secondary | ICD-10-CM | POA: Diagnosis not present

## 2017-04-02 DIAGNOSIS — S199XXA Unspecified injury of neck, initial encounter: Secondary | ICD-10-CM | POA: Diagnosis not present

## 2017-04-02 DIAGNOSIS — M542 Cervicalgia: Secondary | ICD-10-CM | POA: Diagnosis not present

## 2017-04-02 DIAGNOSIS — I10 Essential (primary) hypertension: Secondary | ICD-10-CM | POA: Insufficient documentation

## 2017-04-02 DIAGNOSIS — E785 Hyperlipidemia, unspecified: Secondary | ICD-10-CM | POA: Diagnosis not present

## 2017-04-02 DIAGNOSIS — S299XXA Unspecified injury of thorax, initial encounter: Secondary | ICD-10-CM | POA: Diagnosis not present

## 2017-04-02 DIAGNOSIS — R100 Acute abdomen: Secondary | ICD-10-CM | POA: Diagnosis not present

## 2017-04-02 DIAGNOSIS — Y939 Activity, unspecified: Secondary | ICD-10-CM | POA: Insufficient documentation

## 2017-04-02 DIAGNOSIS — Y92410 Unspecified street and highway as the place of occurrence of the external cause: Secondary | ICD-10-CM | POA: Diagnosis not present

## 2017-04-02 DIAGNOSIS — S3690XA Unspecified injury of unspecified intra-abdominal organ, initial encounter: Secondary | ICD-10-CM | POA: Diagnosis not present

## 2017-04-02 DIAGNOSIS — Z79899 Other long term (current) drug therapy: Secondary | ICD-10-CM | POA: Diagnosis not present

## 2017-04-02 DIAGNOSIS — R1032 Left lower quadrant pain: Secondary | ICD-10-CM | POA: Diagnosis present

## 2017-04-02 DIAGNOSIS — S3991XA Unspecified injury of abdomen, initial encounter: Secondary | ICD-10-CM | POA: Diagnosis not present

## 2017-04-02 LAB — CBC
HCT: 45.5 % (ref 39.0–52.0)
Hemoglobin: 15 g/dL (ref 13.0–17.0)
MCH: 28.8 pg (ref 26.0–34.0)
MCHC: 33 g/dL (ref 30.0–36.0)
MCV: 87.3 fL (ref 78.0–100.0)
PLATELETS: 199 10*3/uL (ref 150–400)
RBC: 5.21 MIL/uL (ref 4.22–5.81)
RDW: 13.8 % (ref 11.5–15.5)
WBC: 10.1 10*3/uL (ref 4.0–10.5)

## 2017-04-02 LAB — URINALYSIS, ROUTINE W REFLEX MICROSCOPIC
Bilirubin Urine: NEGATIVE
Glucose, UA: NEGATIVE mg/dL
Hgb urine dipstick: NEGATIVE
Ketones, ur: NEGATIVE mg/dL
LEUKOCYTES UA: NEGATIVE
Nitrite: NEGATIVE
Protein, ur: NEGATIVE mg/dL
Specific Gravity, Urine: 1.011 (ref 1.005–1.030)
pH: 6 (ref 5.0–8.0)

## 2017-04-02 LAB — COMPREHENSIVE METABOLIC PANEL
ALBUMIN: 4 g/dL (ref 3.5–5.0)
ALK PHOS: 65 U/L (ref 38–126)
ALT: 35 U/L (ref 17–63)
ANION GAP: 9 (ref 5–15)
AST: 27 U/L (ref 15–41)
BILIRUBIN TOTAL: 0.5 mg/dL (ref 0.3–1.2)
BUN: 11 mg/dL (ref 6–20)
CALCIUM: 8.9 mg/dL (ref 8.9–10.3)
CO2: 23 mmol/L (ref 22–32)
Chloride: 107 mmol/L (ref 101–111)
Creatinine, Ser: 0.9 mg/dL (ref 0.61–1.24)
GFR calc Af Amer: >60 mL/min (ref >60)
GFR calc non Af Amer: >60 mL/min (ref >60)
Glucose, Bld: 104 mg/dL — ABNORMAL HIGH (ref 65–99)
POTASSIUM: 3.9 mmol/L (ref 3.5–5.1)
SODIUM: 139 mmol/L (ref 135–145)
TOTAL PROTEIN: 6.8 g/dL (ref 6.5–8.1)

## 2017-04-02 LAB — LIPASE, BLOOD: LIPASE: 34 U/L (ref 11–51)

## 2017-04-02 MED ORDER — SODIUM CHLORIDE 0.9 % IV SOLN
Freq: Once | INTRAVENOUS | Status: AC
  Administered 2017-04-02: 10:00:00 via INTRAVENOUS

## 2017-04-02 MED ORDER — ACETAMINOPHEN 500 MG PO TABS: 1000.0000 mg | ORAL_TABLET | Freq: Once | ORAL | Status: DC

## 2017-04-02 MED ORDER — DIAZEPAM 5 MG PO TABS: 5.0000 mg | ORAL_TABLET | Freq: Two times a day (BID) | ORAL | 0 refills | Status: DC

## 2017-04-02 MED ORDER — IOPAMIDOL (ISOVUE-300) INJECTION 61%
INTRAVENOUS | Status: AC
  Administered 2017-04-02: 100 mL
  Filled 2017-04-02: qty 100

## 2017-04-02 MED ORDER — HYDROCODONE-ACETAMINOPHEN 5-325 MG PO TABS: 1.0000 | ORAL_TABLET | ORAL | 0 refills | Status: DC | PRN
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
## 2017-04-06 DIAGNOSIS — I10 Essential (primary) hypertension: Secondary | ICD-10-CM | POA: Diagnosis not present

## 2017-04-06 DIAGNOSIS — G479 Sleep disorder, unspecified: Secondary | ICD-10-CM | POA: Diagnosis not present

## 2017-04-06 DIAGNOSIS — M62830 Muscle spasm of back: Secondary | ICD-10-CM | POA: Diagnosis not present

## 2017-04-06 DIAGNOSIS — R51 Headache: Secondary | ICD-10-CM | POA: Diagnosis not present

## 2017-04-06 DIAGNOSIS — H9313 Tinnitus, bilateral: Secondary | ICD-10-CM | POA: Diagnosis not present

## 2017-04-07 DIAGNOSIS — G479 Sleep disorder, unspecified: Secondary | ICD-10-CM | POA: Diagnosis not present

## 2017-04-07 DIAGNOSIS — R51 Headache: Secondary | ICD-10-CM | POA: Diagnosis not present

## 2017-04-07 DIAGNOSIS — H9313 Tinnitus, bilateral: Secondary | ICD-10-CM | POA: Diagnosis not present

## 2017-04-08 DIAGNOSIS — H9313 Tinnitus, bilateral: Secondary | ICD-10-CM | POA: Diagnosis not present

## 2017-04-08 DIAGNOSIS — G479 Sleep disorder, unspecified: Secondary | ICD-10-CM | POA: Diagnosis not present

## 2017-04-08 DIAGNOSIS — R51 Headache: Secondary | ICD-10-CM | POA: Diagnosis not present

## 2017-04-09 DIAGNOSIS — H9313 Tinnitus, bilateral: Secondary | ICD-10-CM | POA: Diagnosis not present

## 2017-04-09 DIAGNOSIS — R51 Headache: Secondary | ICD-10-CM | POA: Diagnosis not present

## 2017-04-09 DIAGNOSIS — G479 Sleep disorder, unspecified: Secondary | ICD-10-CM | POA: Diagnosis not present

## 2017-04-12 DIAGNOSIS — G479 Sleep disorder, unspecified: Secondary | ICD-10-CM | POA: Diagnosis not present

## 2017-04-12 DIAGNOSIS — R51 Headache: Secondary | ICD-10-CM | POA: Diagnosis not present

## 2017-04-12 DIAGNOSIS — H9313 Tinnitus, bilateral: Secondary | ICD-10-CM | POA: Diagnosis not present

## 2017-04-14 DIAGNOSIS — H9313 Tinnitus, bilateral: Secondary | ICD-10-CM | POA: Diagnosis not present

## 2017-04-14 DIAGNOSIS — R51 Headache: Secondary | ICD-10-CM | POA: Diagnosis not present

## 2017-04-14 DIAGNOSIS — G479 Sleep disorder, unspecified: Secondary | ICD-10-CM | POA: Diagnosis not present

## 2017-04-15 DIAGNOSIS — H9313 Tinnitus, bilateral: Secondary | ICD-10-CM | POA: Diagnosis not present

## 2017-04-15 DIAGNOSIS — G479 Sleep disorder, unspecified: Secondary | ICD-10-CM | POA: Diagnosis not present

## 2017-04-15 DIAGNOSIS — R51 Headache: Secondary | ICD-10-CM | POA: Diagnosis not present

## 2017-04-19 DIAGNOSIS — G479 Sleep disorder, unspecified: Secondary | ICD-10-CM | POA: Diagnosis not present

## 2017-04-19 DIAGNOSIS — R51 Headache: Secondary | ICD-10-CM | POA: Diagnosis not present

## 2017-04-19 DIAGNOSIS — H9313 Tinnitus, bilateral: Secondary | ICD-10-CM | POA: Diagnosis not present

## 2017-04-21 DIAGNOSIS — R51 Headache: Secondary | ICD-10-CM | POA: Diagnosis not present

## 2017-04-21 DIAGNOSIS — H9313 Tinnitus, bilateral: Secondary | ICD-10-CM | POA: Diagnosis not present

## 2017-04-21 DIAGNOSIS — G479 Sleep disorder, unspecified: Secondary | ICD-10-CM | POA: Diagnosis not present

## 2017-04-23 DIAGNOSIS — R51 Headache: Secondary | ICD-10-CM | POA: Diagnosis not present

## 2017-04-23 DIAGNOSIS — H9313 Tinnitus, bilateral: Secondary | ICD-10-CM | POA: Diagnosis not present

## 2017-04-23 DIAGNOSIS — G479 Sleep disorder, unspecified: Secondary | ICD-10-CM | POA: Diagnosis not present

## 2017-04-26 DIAGNOSIS — H9313 Tinnitus, bilateral: Secondary | ICD-10-CM | POA: Diagnosis not present

## 2017-04-26 DIAGNOSIS — R51 Headache: Secondary | ICD-10-CM | POA: Diagnosis not present

## 2017-04-26 DIAGNOSIS — G479 Sleep disorder, unspecified: Secondary | ICD-10-CM | POA: Diagnosis not present

## 2017-04-28 DIAGNOSIS — H9313 Tinnitus, bilateral: Secondary | ICD-10-CM | POA: Diagnosis not present

## 2017-04-28 DIAGNOSIS — R51 Headache: Secondary | ICD-10-CM | POA: Diagnosis not present

## 2017-04-28 DIAGNOSIS — G479 Sleep disorder, unspecified: Secondary | ICD-10-CM | POA: Diagnosis not present

## 2017-04-29 DIAGNOSIS — G479 Sleep disorder, unspecified: Secondary | ICD-10-CM | POA: Diagnosis not present

## 2017-04-29 DIAGNOSIS — H9313 Tinnitus, bilateral: Secondary | ICD-10-CM | POA: Diagnosis not present

## 2017-04-29 DIAGNOSIS — R51 Headache: Secondary | ICD-10-CM | POA: Diagnosis not present

## 2017-05-04 DIAGNOSIS — G479 Sleep disorder, unspecified: Secondary | ICD-10-CM | POA: Diagnosis not present

## 2017-05-04 DIAGNOSIS — H9313 Tinnitus, bilateral: Secondary | ICD-10-CM | POA: Diagnosis not present

## 2017-05-04 DIAGNOSIS — R51 Headache: Secondary | ICD-10-CM | POA: Diagnosis not present

## 2017-05-07 DIAGNOSIS — R51 Headache: Secondary | ICD-10-CM | POA: Diagnosis not present

## 2017-05-07 DIAGNOSIS — H9313 Tinnitus, bilateral: Secondary | ICD-10-CM | POA: Diagnosis not present

## 2017-05-07 DIAGNOSIS — G479 Sleep disorder, unspecified: Secondary | ICD-10-CM | POA: Diagnosis not present

## 2017-05-11 DIAGNOSIS — R51 Headache: Secondary | ICD-10-CM | POA: Diagnosis not present

## 2017-05-11 DIAGNOSIS — G479 Sleep disorder, unspecified: Secondary | ICD-10-CM | POA: Diagnosis not present

## 2017-05-11 DIAGNOSIS — H9313 Tinnitus, bilateral: Secondary | ICD-10-CM | POA: Diagnosis not present

## 2017-05-12 DIAGNOSIS — R51 Headache: Secondary | ICD-10-CM | POA: Diagnosis not present

## 2017-05-12 DIAGNOSIS — G479 Sleep disorder, unspecified: Secondary | ICD-10-CM | POA: Diagnosis not present

## 2017-05-12 DIAGNOSIS — H9313 Tinnitus, bilateral: Secondary | ICD-10-CM | POA: Diagnosis not present

## 2017-05-13 DIAGNOSIS — M7912 Myalgia of auxiliary muscles, head and neck: Secondary | ICD-10-CM | POA: Diagnosis not present

## 2017-05-13 DIAGNOSIS — R51 Headache: Secondary | ICD-10-CM | POA: Diagnosis not present

## 2017-05-13 DIAGNOSIS — H9313 Tinnitus, bilateral: Secondary | ICD-10-CM | POA: Diagnosis not present

## 2017-05-14 DIAGNOSIS — Z0000 Encounter for general adult medical examination without abnormal findings: Secondary | ICD-10-CM | POA: Diagnosis not present

## 2017-05-18 DIAGNOSIS — R51 Headache: Secondary | ICD-10-CM | POA: Diagnosis not present

## 2017-05-18 DIAGNOSIS — H9313 Tinnitus, bilateral: Secondary | ICD-10-CM | POA: Diagnosis not present

## 2017-05-18 DIAGNOSIS — G479 Sleep disorder, unspecified: Secondary | ICD-10-CM | POA: Diagnosis not present

## 2017-05-20 DIAGNOSIS — G479 Sleep disorder, unspecified: Secondary | ICD-10-CM | POA: Diagnosis not present

## 2017-05-20 DIAGNOSIS — R51 Headache: Secondary | ICD-10-CM | POA: Diagnosis not present

## 2017-05-20 DIAGNOSIS — H9313 Tinnitus, bilateral: Secondary | ICD-10-CM | POA: Diagnosis not present

## 2017-05-25 DIAGNOSIS — H9313 Tinnitus, bilateral: Secondary | ICD-10-CM | POA: Diagnosis not present

## 2017-05-25 DIAGNOSIS — R51 Headache: Secondary | ICD-10-CM | POA: Diagnosis not present

## 2017-05-25 DIAGNOSIS — G479 Sleep disorder, unspecified: Secondary | ICD-10-CM | POA: Diagnosis not present

## 2017-05-28 DIAGNOSIS — H9313 Tinnitus, bilateral: Secondary | ICD-10-CM | POA: Diagnosis not present

## 2017-05-28 DIAGNOSIS — G479 Sleep disorder, unspecified: Secondary | ICD-10-CM | POA: Diagnosis not present

## 2017-05-28 DIAGNOSIS — R51 Headache: Secondary | ICD-10-CM | POA: Diagnosis not present

## 2017-06-01 DIAGNOSIS — R51 Headache: Secondary | ICD-10-CM | POA: Diagnosis not present

## 2017-06-01 DIAGNOSIS — H9313 Tinnitus, bilateral: Secondary | ICD-10-CM | POA: Diagnosis not present

## 2017-06-01 DIAGNOSIS — G479 Sleep disorder, unspecified: Secondary | ICD-10-CM | POA: Diagnosis not present

## 2017-06-03 DIAGNOSIS — H9313 Tinnitus, bilateral: Secondary | ICD-10-CM | POA: Diagnosis not present

## 2017-06-03 DIAGNOSIS — R51 Headache: Secondary | ICD-10-CM | POA: Diagnosis not present

## 2017-06-03 DIAGNOSIS — G479 Sleep disorder, unspecified: Secondary | ICD-10-CM | POA: Diagnosis not present

## 2017-06-18 DIAGNOSIS — M546 Pain in thoracic spine: Secondary | ICD-10-CM | POA: Diagnosis not present

## 2017-06-25 DIAGNOSIS — M546 Pain in thoracic spine: Secondary | ICD-10-CM | POA: Diagnosis not present

## 2017-07-02 DIAGNOSIS — M546 Pain in thoracic spine: Secondary | ICD-10-CM | POA: Diagnosis not present

## 2017-07-13 DIAGNOSIS — M62830 Muscle spasm of back: Secondary | ICD-10-CM | POA: Diagnosis not present

## 2017-07-13 DIAGNOSIS — M546 Pain in thoracic spine: Secondary | ICD-10-CM | POA: Diagnosis not present

## 2017-09-30 DIAGNOSIS — M7712 Lateral epicondylitis, left elbow: Secondary | ICD-10-CM | POA: Diagnosis not present

## 2017-11-02 DIAGNOSIS — M7712 Lateral epicondylitis, left elbow: Secondary | ICD-10-CM | POA: Diagnosis not present

## 2017-11-15 DIAGNOSIS — I10 Essential (primary) hypertension: Secondary | ICD-10-CM | POA: Diagnosis not present

## 2017-11-15 DIAGNOSIS — E785 Hyperlipidemia, unspecified: Secondary | ICD-10-CM | POA: Diagnosis not present

## 2017-11-15 DIAGNOSIS — R635 Abnormal weight gain: Secondary | ICD-10-CM | POA: Diagnosis not present

## 2017-11-19 DIAGNOSIS — M75121 Complete rotator cuff tear or rupture of right shoulder, not specified as traumatic: Secondary | ICD-10-CM | POA: Diagnosis not present

## 2017-12-13 DIAGNOSIS — G8918 Other acute postprocedural pain: Secondary | ICD-10-CM | POA: Diagnosis not present

## 2017-12-13 DIAGNOSIS — M75111 Incomplete rotator cuff tear or rupture of right shoulder, not specified as traumatic: Secondary | ICD-10-CM | POA: Diagnosis not present

## 2017-12-13 DIAGNOSIS — M7541 Impingement syndrome of right shoulder: Secondary | ICD-10-CM | POA: Diagnosis not present

## 2017-12-22 DIAGNOSIS — M75111 Incomplete rotator cuff tear or rupture of right shoulder, not specified as traumatic: Secondary | ICD-10-CM | POA: Diagnosis not present

## 2017-12-22 DIAGNOSIS — M25511 Pain in right shoulder: Secondary | ICD-10-CM | POA: Diagnosis not present

## 2017-12-23 DIAGNOSIS — M75111 Incomplete rotator cuff tear or rupture of right shoulder, not specified as traumatic: Secondary | ICD-10-CM | POA: Diagnosis not present

## 2017-12-24 DIAGNOSIS — M75111 Incomplete rotator cuff tear or rupture of right shoulder, not specified as traumatic: Secondary | ICD-10-CM | POA: Diagnosis not present

## 2017-12-24 DIAGNOSIS — M25511 Pain in right shoulder: Secondary | ICD-10-CM | POA: Diagnosis not present

## 2017-12-28 DIAGNOSIS — M75111 Incomplete rotator cuff tear or rupture of right shoulder, not specified as traumatic: Secondary | ICD-10-CM | POA: Diagnosis not present

## 2017-12-28 DIAGNOSIS — M25511 Pain in right shoulder: Secondary | ICD-10-CM | POA: Diagnosis not present

## 2017-12-31 DIAGNOSIS — M75111 Incomplete rotator cuff tear or rupture of right shoulder, not specified as traumatic: Secondary | ICD-10-CM | POA: Diagnosis not present

## 2017-12-31 DIAGNOSIS — M25511 Pain in right shoulder: Secondary | ICD-10-CM | POA: Diagnosis not present

## 2018-01-04 DIAGNOSIS — M75111 Incomplete rotator cuff tear or rupture of right shoulder, not specified as traumatic: Secondary | ICD-10-CM | POA: Diagnosis not present

## 2018-01-04 DIAGNOSIS — M25511 Pain in right shoulder: Secondary | ICD-10-CM | POA: Diagnosis not present

## 2018-01-11 DIAGNOSIS — M25511 Pain in right shoulder: Secondary | ICD-10-CM | POA: Diagnosis not present

## 2018-01-11 DIAGNOSIS — M75111 Incomplete rotator cuff tear or rupture of right shoulder, not specified as traumatic: Secondary | ICD-10-CM | POA: Diagnosis not present

## 2018-01-13 DIAGNOSIS — M25511 Pain in right shoulder: Secondary | ICD-10-CM | POA: Diagnosis not present

## 2018-01-13 DIAGNOSIS — M75111 Incomplete rotator cuff tear or rupture of right shoulder, not specified as traumatic: Secondary | ICD-10-CM | POA: Diagnosis not present

## 2018-01-17 DIAGNOSIS — M25511 Pain in right shoulder: Secondary | ICD-10-CM | POA: Diagnosis not present

## 2018-01-17 DIAGNOSIS — M75111 Incomplete rotator cuff tear or rupture of right shoulder, not specified as traumatic: Secondary | ICD-10-CM | POA: Diagnosis not present

## 2018-01-25 DIAGNOSIS — M75111 Incomplete rotator cuff tear or rupture of right shoulder, not specified as traumatic: Secondary | ICD-10-CM | POA: Diagnosis not present

## 2018-01-25 DIAGNOSIS — M25511 Pain in right shoulder: Secondary | ICD-10-CM | POA: Diagnosis not present

## 2018-01-27 DIAGNOSIS — M25511 Pain in right shoulder: Secondary | ICD-10-CM | POA: Diagnosis not present

## 2018-01-27 DIAGNOSIS — M75111 Incomplete rotator cuff tear or rupture of right shoulder, not specified as traumatic: Secondary | ICD-10-CM | POA: Diagnosis not present

## 2018-01-31 DIAGNOSIS — M25511 Pain in right shoulder: Secondary | ICD-10-CM | POA: Diagnosis not present

## 2018-01-31 DIAGNOSIS — M75111 Incomplete rotator cuff tear or rupture of right shoulder, not specified as traumatic: Secondary | ICD-10-CM | POA: Diagnosis not present

## 2018-02-03 DIAGNOSIS — M25511 Pain in right shoulder: Secondary | ICD-10-CM | POA: Diagnosis not present

## 2018-02-03 DIAGNOSIS — M75111 Incomplete rotator cuff tear or rupture of right shoulder, not specified as traumatic: Secondary | ICD-10-CM | POA: Diagnosis not present

## 2018-02-08 DIAGNOSIS — M75111 Incomplete rotator cuff tear or rupture of right shoulder, not specified as traumatic: Secondary | ICD-10-CM | POA: Diagnosis not present

## 2018-02-08 DIAGNOSIS — M25511 Pain in right shoulder: Secondary | ICD-10-CM | POA: Diagnosis not present

## 2018-02-10 DIAGNOSIS — M75111 Incomplete rotator cuff tear or rupture of right shoulder, not specified as traumatic: Secondary | ICD-10-CM | POA: Diagnosis not present

## 2018-02-10 DIAGNOSIS — M25511 Pain in right shoulder: Secondary | ICD-10-CM | POA: Diagnosis not present

## 2018-02-15 DIAGNOSIS — M75111 Incomplete rotator cuff tear or rupture of right shoulder, not specified as traumatic: Secondary | ICD-10-CM | POA: Diagnosis not present

## 2018-02-15 DIAGNOSIS — M25511 Pain in right shoulder: Secondary | ICD-10-CM | POA: Diagnosis not present

## 2018-02-21 DIAGNOSIS — M25511 Pain in right shoulder: Secondary | ICD-10-CM | POA: Diagnosis not present

## 2018-02-21 DIAGNOSIS — M75111 Incomplete rotator cuff tear or rupture of right shoulder, not specified as traumatic: Secondary | ICD-10-CM | POA: Diagnosis not present

## 2018-02-24 DIAGNOSIS — M25511 Pain in right shoulder: Secondary | ICD-10-CM | POA: Diagnosis not present

## 2018-02-24 DIAGNOSIS — M75111 Incomplete rotator cuff tear or rupture of right shoulder, not specified as traumatic: Secondary | ICD-10-CM | POA: Diagnosis not present

## 2018-02-28 DIAGNOSIS — M25511 Pain in right shoulder: Secondary | ICD-10-CM | POA: Diagnosis not present

## 2018-02-28 DIAGNOSIS — M75111 Incomplete rotator cuff tear or rupture of right shoulder, not specified as traumatic: Secondary | ICD-10-CM | POA: Diagnosis not present

## 2018-03-03 DIAGNOSIS — M25511 Pain in right shoulder: Secondary | ICD-10-CM | POA: Diagnosis not present

## 2018-03-03 DIAGNOSIS — M75111 Incomplete rotator cuff tear or rupture of right shoulder, not specified as traumatic: Secondary | ICD-10-CM | POA: Diagnosis not present

## 2018-03-07 DIAGNOSIS — M75111 Incomplete rotator cuff tear or rupture of right shoulder, not specified as traumatic: Secondary | ICD-10-CM | POA: Diagnosis not present

## 2018-03-07 DIAGNOSIS — M25511 Pain in right shoulder: Secondary | ICD-10-CM | POA: Diagnosis not present

## 2018-03-10 DIAGNOSIS — M25511 Pain in right shoulder: Secondary | ICD-10-CM | POA: Diagnosis not present

## 2018-03-10 DIAGNOSIS — M75111 Incomplete rotator cuff tear or rupture of right shoulder, not specified as traumatic: Secondary | ICD-10-CM | POA: Diagnosis not present

## 2018-03-16 DIAGNOSIS — M25511 Pain in right shoulder: Secondary | ICD-10-CM | POA: Diagnosis not present

## 2018-03-16 DIAGNOSIS — M75111 Incomplete rotator cuff tear or rupture of right shoulder, not specified as traumatic: Secondary | ICD-10-CM | POA: Diagnosis not present

## 2018-03-25 DIAGNOSIS — M25511 Pain in right shoulder: Secondary | ICD-10-CM | POA: Diagnosis not present

## 2018-03-25 DIAGNOSIS — M75111 Incomplete rotator cuff tear or rupture of right shoulder, not specified as traumatic: Secondary | ICD-10-CM | POA: Diagnosis not present

## 2018-03-30 DIAGNOSIS — M75111 Incomplete rotator cuff tear or rupture of right shoulder, not specified as traumatic: Secondary | ICD-10-CM | POA: Diagnosis not present

## 2018-03-30 DIAGNOSIS — M25511 Pain in right shoulder: Secondary | ICD-10-CM | POA: Diagnosis not present

## 2018-04-12 DIAGNOSIS — M25511 Pain in right shoulder: Secondary | ICD-10-CM | POA: Diagnosis not present

## 2018-04-12 DIAGNOSIS — M75111 Incomplete rotator cuff tear or rupture of right shoulder, not specified as traumatic: Secondary | ICD-10-CM | POA: Diagnosis not present

## 2018-04-19 DIAGNOSIS — M25511 Pain in right shoulder: Secondary | ICD-10-CM | POA: Diagnosis not present

## 2018-04-19 DIAGNOSIS — M75111 Incomplete rotator cuff tear or rupture of right shoulder, not specified as traumatic: Secondary | ICD-10-CM | POA: Diagnosis not present

## 2018-04-20 DIAGNOSIS — M75121 Complete rotator cuff tear or rupture of right shoulder, not specified as traumatic: Secondary | ICD-10-CM | POA: Diagnosis not present

## 2018-04-26 DIAGNOSIS — M75111 Incomplete rotator cuff tear or rupture of right shoulder, not specified as traumatic: Secondary | ICD-10-CM | POA: Diagnosis not present

## 2018-04-26 DIAGNOSIS — M25511 Pain in right shoulder: Secondary | ICD-10-CM | POA: Diagnosis not present

## 2018-05-11 DIAGNOSIS — M25511 Pain in right shoulder: Secondary | ICD-10-CM | POA: Diagnosis not present

## 2018-05-11 DIAGNOSIS — M75111 Incomplete rotator cuff tear or rupture of right shoulder, not specified as traumatic: Secondary | ICD-10-CM | POA: Diagnosis not present

## 2018-05-13 DIAGNOSIS — N341 Nonspecific urethritis: Secondary | ICD-10-CM | POA: Diagnosis not present

## 2018-05-19 DIAGNOSIS — M75111 Incomplete rotator cuff tear or rupture of right shoulder, not specified as traumatic: Secondary | ICD-10-CM | POA: Diagnosis not present

## 2018-05-19 DIAGNOSIS — M25511 Pain in right shoulder: Secondary | ICD-10-CM | POA: Diagnosis not present

## 2018-05-20 DIAGNOSIS — I10 Essential (primary) hypertension: Secondary | ICD-10-CM | POA: Diagnosis not present

## 2018-05-20 DIAGNOSIS — E785 Hyperlipidemia, unspecified: Secondary | ICD-10-CM | POA: Diagnosis not present

## 2018-05-20 DIAGNOSIS — R635 Abnormal weight gain: Secondary | ICD-10-CM | POA: Diagnosis not present
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
## 2021-12-26 ENCOUNTER — Other Ambulatory Visit: Payer: Self-pay | Admitting: Urology

## 2021-12-31 ENCOUNTER — Other Ambulatory Visit: Payer: Self-pay | Admitting: Urology

## 2022-01-26 ENCOUNTER — Encounter (HOSPITAL_BASED_OUTPATIENT_CLINIC_OR_DEPARTMENT_OTHER): Payer: Self-pay | Admitting: Urology
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
## 2022-01-30 ENCOUNTER — Encounter (HOSPITAL_BASED_OUTPATIENT_CLINIC_OR_DEPARTMENT_OTHER)
Admission: RE | Disposition: A | Discharge status: Home / Self Care | Payer: Self-pay | Source: Home / Self Care | Attending: Urology

## 2022-01-30 ENCOUNTER — Ambulatory Visit (HOSPITAL_BASED_OUTPATIENT_CLINIC_OR_DEPARTMENT_OTHER)
Admission: RE | Admit: 2022-01-30 | Discharge: 2022-01-30 | Disposition: A | Discharge status: Home / Self Care | Payer: 59 | Attending: Urology | Admitting: Urology

## 2022-01-30 ENCOUNTER — Other Ambulatory Visit: Payer: Self-pay

## 2022-01-30 ENCOUNTER — Encounter (HOSPITAL_BASED_OUTPATIENT_CLINIC_OR_DEPARTMENT_OTHER): Payer: Self-pay | Admitting: Urology

## 2022-01-30 ENCOUNTER — Ambulatory Visit (HOSPITAL_BASED_OUTPATIENT_CLINIC_OR_DEPARTMENT_OTHER): Payer: 59 | Admitting: Anesthesiology

## 2022-01-30 DIAGNOSIS — N4341 Spermatocele of epididymis, single: Secondary | ICD-10-CM | POA: Diagnosis present

## 2022-01-30 DIAGNOSIS — K219 Gastro-esophageal reflux disease without esophagitis: Secondary | ICD-10-CM | POA: Diagnosis not present

## 2022-01-30 DIAGNOSIS — Z6832 Body mass index (BMI) 32.0-32.9, adult: Secondary | ICD-10-CM | POA: Diagnosis not present

## 2022-01-30 DIAGNOSIS — D34 Benign neoplasm of thyroid gland: Secondary | ICD-10-CM | POA: Diagnosis not present

## 2022-01-30 DIAGNOSIS — I10 Essential (primary) hypertension: Secondary | ICD-10-CM | POA: Diagnosis not present

## 2022-01-30 DIAGNOSIS — E669 Obesity, unspecified: Secondary | ICD-10-CM | POA: Insufficient documentation

## 2022-01-30 HISTORY — PX: SPERMATOCELECTOMY: SHX2420

## 2022-01-30 LAB — POCT I-STAT, CHEM 8
BUN: 15 mg/dL (ref 6–20)
Calcium, Ion: 1.21 mmol/L (ref 1.15–1.40)
Chloride: 104 mmol/L (ref 98–111)
Creatinine, Ser: 0.7 mg/dL (ref 0.61–1.24)
Glucose, Bld: 125 mg/dL — ABNORMAL HIGH (ref 70–99)
HCT: 47 % (ref 39.0–52.0)
Hemoglobin: 16 g/dL (ref 13.0–17.0)
Potassium: 4 mmol/L (ref 3.5–5.1)
Sodium: 141 mmol/L (ref 135–145)
TCO2: 23 mmol/L (ref 22–32)

## 2022-01-30 SURGERY — EXCISION, SPERMATOCELE
Anesthesia: General | Site: Scrotum | Laterality: Left

## 2022-01-30 MED ORDER — OXYCODONE HCL 5 MG PO TABS
ORAL_TABLET | ORAL | Status: AC
  Filled 2022-01-30: qty 1

## 2022-01-30 MED ORDER — PROMETHAZINE HCL 25 MG/ML IJ SOLN: 6.2500 mg | INTRAMUSCULAR | Status: DC | PRN

## 2022-01-30 MED ORDER — PROPOFOL 10 MG/ML IV BOLUS
INTRAVENOUS | Status: AC
  Filled 2022-01-30: qty 20

## 2022-01-30 MED ORDER — DEXAMETHASONE SODIUM PHOSPHATE 10 MG/ML IJ SOLN
INTRAMUSCULAR | Status: DC | PRN
  Administered 2022-01-30: 5 mg via INTRAVENOUS

## 2022-01-30 MED ORDER — ONDANSETRON HCL 4 MG/2ML IJ SOLN
INTRAMUSCULAR | Status: DC | PRN
  Administered 2022-01-30: 4 mg via INTRAVENOUS

## 2022-01-30 MED ORDER — SODIUM CHLORIDE 0.9% FLUSH: 3.0000 mL | Freq: Two times a day (BID) | INTRAVENOUS | Status: DC

## 2022-01-30 MED ORDER — DEXAMETHASONE SODIUM PHOSPHATE 10 MG/ML IJ SOLN
INTRAMUSCULAR | Status: AC
  Filled 2022-01-30: qty 1

## 2022-01-30 MED ORDER — ACETAMINOPHEN 325 MG PO TABS: 650.0000 mg | ORAL_TABLET | ORAL | Status: DC | PRN

## 2022-01-30 MED ORDER — CIPROFLOXACIN IN D5W 400 MG/200ML IV SOLN
INTRAVENOUS | Status: AC
  Filled 2022-01-30: qty 200

## 2022-01-30 MED ORDER — CIPROFLOXACIN IN D5W 400 MG/200ML IV SOLN
400.0000 mg | Freq: Two times a day (BID) | INTRAVENOUS | Status: DC
  Administered 2022-01-30: 400 mg via INTRAVENOUS

## 2022-01-30 MED ORDER — LIDOCAINE 2% (20 MG/ML) 5 ML SYRINGE
INTRAMUSCULAR | Status: DC | PRN
  Administered 2022-01-30: 60 mg via INTRAVENOUS

## 2022-01-30 MED ORDER — PHENYLEPHRINE 80 MCG/ML (10ML) SYRINGE FOR IV PUSH (FOR BLOOD PRESSURE SUPPORT)
PREFILLED_SYRINGE | INTRAVENOUS | Status: DC | PRN
  Administered 2022-01-30 (×2): 80 ug via INTRAVENOUS

## 2022-01-30 MED ORDER — SODIUM CHLORIDE 0.9 % IV SOLN: 250.0000 mL | INTRAVENOUS | Status: DC | PRN

## 2022-01-30 MED ORDER — ONDANSETRON HCL 4 MG/2ML IJ SOLN
INTRAMUSCULAR | Status: AC
  Filled 2022-01-30: qty 2

## 2022-01-30 MED ORDER — MIDAZOLAM HCL 2 MG/2ML IJ SOLN
INTRAMUSCULAR | Status: AC
  Filled 2022-01-30: qty 2

## 2022-01-30 MED ORDER — LACTATED RINGERS IV SOLN: INTRAVENOUS | Status: DC

## 2022-01-30 MED ORDER — SODIUM CHLORIDE 0.9% FLUSH: 3.0000 mL | INTRAVENOUS | Status: DC | PRN

## 2022-01-30 MED ORDER — FENTANYL CITRATE (PF) 100 MCG/2ML IJ SOLN
INTRAMUSCULAR | Status: DC | PRN
  Administered 2022-01-30 (×2): 50 ug via INTRAVENOUS

## 2022-01-30 MED ORDER — LIDOCAINE HCL (PF) 2 % IJ SOLN
INTRAMUSCULAR | Status: AC
  Filled 2022-01-30: qty 5

## 2022-01-30 MED ORDER — MIDAZOLAM HCL 5 MG/5ML IJ SOLN
INTRAMUSCULAR | Status: DC | PRN
  Administered 2022-01-30: 2 mg via INTRAVENOUS

## 2022-01-30 MED ORDER — OXYCODONE HCL 5 MG/5ML PO SOLN: 5.0000 mg | Freq: Once | ORAL | Status: AC | PRN

## 2022-01-30 MED ORDER — PROPOFOL 10 MG/ML IV BOLUS
INTRAVENOUS | Status: DC | PRN
  Administered 2022-01-30: 40 mg via INTRAVENOUS
  Administered 2022-01-30: 200 mg via INTRAVENOUS

## 2022-01-30 MED ORDER — OXYCODONE HCL 5 MG PO TABS
5.0000 mg | ORAL_TABLET | Freq: Once | ORAL | Status: AC | PRN
  Administered 2022-01-30: 5 mg via ORAL

## 2022-01-30 MED ORDER — FENTANYL CITRATE (PF) 100 MCG/2ML IJ SOLN: 25.0000 ug | INTRAMUSCULAR | Status: DC | PRN

## 2022-01-30 MED ORDER — BUPIVACAINE HCL (PF) 0.25 % IJ SOLN
INTRAMUSCULAR | Status: DC | PRN
  Administered 2022-01-30: 11 mL

## 2022-01-30 MED ORDER — PROPOFOL 500 MG/50ML IV EMUL
INTRAVENOUS | Status: AC
  Filled 2022-01-30: qty 50

## 2022-01-30 MED ORDER — ACETAMINOPHEN 500 MG PO TABS
ORAL_TABLET | ORAL | Status: AC
  Filled 2022-01-30: qty 2

## 2022-01-30 MED ORDER — ACETAMINOPHEN 325 MG RE SUPP: 650.0000 mg | RECTAL | Status: DC | PRN

## 2022-01-30 MED ORDER — FENTANYL CITRATE (PF) 100 MCG/2ML IJ SOLN
INTRAMUSCULAR | Status: AC
  Filled 2022-01-30: qty 2

## 2022-01-30 MED ORDER — ACETAMINOPHEN 500 MG PO TABS
1000.0000 mg | ORAL_TABLET | Freq: Once | ORAL | Status: AC
  Administered 2022-01-30: 1000 mg via ORAL

## 2022-01-30 MED ORDER — 0.9 % SODIUM CHLORIDE (POUR BTL) OPTIME
TOPICAL | Status: DC | PRN
  Administered 2022-01-30: 500 mL

## 2022-01-30 MED ORDER — PHENYLEPHRINE 80 MCG/ML (10ML) SYRINGE FOR IV PUSH (FOR BLOOD PRESSURE SUPPORT)
PREFILLED_SYRINGE | INTRAVENOUS | Status: AC
  Filled 2022-01-30: qty 10

## 2022-01-30 MED ORDER — TRAMADOL HCL 50 MG PO TABS: 50.0000 mg | ORAL_TABLET | Freq: Four times a day (QID) | ORAL | 0 refills | Status: AC | PRN

## 2022-01-30 MED ORDER — TRAMADOL HCL 50 MG PO TABS: 50.0000 mg | ORAL_TABLET | Freq: Four times a day (QID) | ORAL | Status: DC | PRN

## 2022-01-30 SURGICAL SUPPLY — 35 items
BLADE SURG 15 STRL LF DISP TIS (BLADE) ×1 IMPLANT
BLADE SURG 15 STRL SS (BLADE) ×1
BNDG GAUZE DERMACEA FLUFF 4 (GAUZE/BANDAGES/DRESSINGS) ×1 IMPLANT
BNDG GZE DERMACEA 4 6PLY (GAUZE/BANDAGES/DRESSINGS) ×1
CLOTH BEACON ORANGE TIMEOUT ST (SAFETY) ×1 IMPLANT
COVER BACK TABLE 60X90IN (DRAPES) ×1 IMPLANT
COVER MAYO STAND STRL (DRAPES) ×1 IMPLANT
DRAIN PENROSE 0.25X18 (DRAIN) IMPLANT
DRAPE LAPAROTOMY 100X72 PEDS (DRAPES) ×1 IMPLANT
ELECT REM PT RETURN 9FT ADLT (ELECTROSURGICAL) ×1
ELECTRODE REM PT RTRN 9FT ADLT (ELECTROSURGICAL) ×1 IMPLANT
GAUZE 4X4 16PLY ~~LOC~~+RFID DBL (SPONGE) ×1 IMPLANT
GLOVE BIO SURGEON STRL SZ 6.5 (GLOVE) IMPLANT
GLOVE BIOGEL PI IND STRL 6.5 (GLOVE) IMPLANT
GLOVE BIOGEL PI IND STRL 7.0 (GLOVE) IMPLANT
GLOVE SURG SS PI 8.0 STRL IVOR (GLOVE) ×1 IMPLANT
GOWN STRL REUS W/ TWL LRG LVL3 (GOWN DISPOSABLE) IMPLANT
GOWN STRL REUS W/TWL LRG LVL3 (GOWN DISPOSABLE) ×4 IMPLANT
KIT TURNOVER CYSTO (KITS) ×1 IMPLANT
MANIFOLD NEPTUNE II (INSTRUMENTS) IMPLANT
NEEDLE HYPO 22GX1.5 SAFETY (NEEDLE) ×1 IMPLANT
NS IRRIG 500ML POUR BTL (IV SOLUTION) IMPLANT
PACK BASIN DAY SURGERY FS (CUSTOM PROCEDURE TRAY) ×1 IMPLANT
PENCIL SMOKE EVACUATOR (MISCELLANEOUS) ×1 IMPLANT
SOL PREP POV-IOD 4OZ 10% (MISCELLANEOUS) IMPLANT
SPIKE FLUID TRANSFER (MISCELLANEOUS) IMPLANT
SUPPORTER ATHLETIC XL (MISCELLANEOUS) ×1
SUPPORTER ATHLETIC XL 3X44-50X (MISCELLANEOUS) IMPLANT
SUT CHROMIC 3 0 SH 27 (SUTURE) ×1 IMPLANT
SUT VICRYL 0 TIES 12 18 (SUTURE) ×1 IMPLANT
SYR BULB IRRIG 60ML STRL (SYRINGE) ×1 IMPLANT
SYR CONTROL 10ML LL (SYRINGE) ×1 IMPLANT
TRAY DSU PREP LF (CUSTOM PROCEDURE TRAY) ×1 IMPLANT
TUBE CONNECTING 12X1/4 (SUCTIONS) ×1 IMPLANT
YANKAUER SUCT BULB TIP NO VENT (SUCTIONS) IMPLANT

## 2022-01-30 NOTE — H&P (Signed)
CC: left scrotal mass.   Hx: Randy Harrison is a 62 yo male who is sent by Dr. Fara Olden for a 2cm lesion above the left testicle. He noticed it the morning he had his last physical scheduled. He has mild discomfort on occasion. He had a left testicular torsion in the 1990's. He has not had a vasectomy. He is voiding well with some urgency and nocturia with sodas and caffeine.     CC: AUA Questions Scoring.  HPI: Randy Harrison is a 62 year-old male patient who was referred by Dr. Leeroy Cha, MD who is here for evaluation.      AUA Symptom Score: He never has the sensation of not emptying his bladder completely after finishing urinating. He never has to urinate again less that two hours after he has finished urinating. He does not have to stop and start again several times when he urinates. Less than 50% of the time he finds it difficult to postpone urination. He never has a weak urinary stream. He never has to push or strain to begin urination. He has to get up to urinate 1 time from the time he goes to bed until the time he gets up in the morning.   Calculated AUA Symptom Score: 3    ALLERGIES: Aspirin Codeine Sulfa    MEDICATIONS: Hydrochlorothiazide 12.5 mg tablet  Simvastatin 40 mg tablet  Amlodipine Besylate 10 mg tablet  Losartan Potassium 100 mg tablet     GU PSH: None     PSH Notes: Testicular torsion(L) , shoulder rotator cuff (L), thyroid lobectomy (R), shoulder rotator cuff (R), Tennis elbow (L)   NON-GU PSH: Knee Arthroscopy, Left     GU PMH: None   NON-GU PMH: Hypercholesterolemia Hypertension    FAMILY HISTORY: None   SOCIAL HISTORY: Marital Status: Married Preferred Language: English; Ethnicity: Not Hispanic Or Latino; Race: White Current Smoking Status: Patient has never smoked.   Tobacco Use Assessment Completed: Used Tobacco in last 30 days? Does not use smokeless tobacco. Has never drank.  Does not use drugs. Drinks 1 caffeinated drink per  day.    REVIEW OF SYSTEMS:    GU Review Male:   Patient reports burning/ pain with urination and get up at night to urinate. Patient denies frequent urination, hard to postpone urination, leakage of urine, stream starts and stops, trouble starting your stream, have to strain to urinate , erection problems, and penile pain.  Gastrointestinal (Upper):   Patient denies nausea, vomiting, and indigestion/ heartburn.  Gastrointestinal (Lower):   Patient denies diarrhea and constipation.  Constitutional:   Patient reports night sweats and fatigue. Patient denies fever and weight loss.  Skin:   Patient denies skin rash/ lesion and itching.  Eyes:   Patient denies blurred vision and double vision.  Ears/ Nose/ Throat:   Patient denies sore throat and sinus problems.  Hematologic/Lymphatic:   Patient reports easy bruising. Patient denies swollen glands.  Cardiovascular:   Patient denies leg swelling and chest pains.  Respiratory:   Patient denies cough and shortness of breath.  Endocrine:   Patient denies excessive thirst.  Musculoskeletal:   Patient reports back pain and joint pain.   Neurological:   Patient denies headaches and dizziness.  Psychologic:   Patient denies depression and anxiety.   VITAL SIGNS:      12/17/2021 02:52 PM  Weight 254 lb / 115.21 kg  Height 74 in / 187.96 cm  BP 132/86 mmHg  Heart Rate 78 /min  Temperature 98.2  F / 36.7 C  BMI 32.6 kg/m   GU PHYSICAL EXAMINATION:    Scrotum: No lesions. No edema. No cysts. No warts.  Epididymides: Right: right head contains a spermatocele that is 2-3cm and minimally tender. Right: no cysts, no tenderness, no induration, no enlargement. Left: No spermatocele, no masses, no cysts, no tenderness, no induration, no enlargement.   Testes: No tenderness, no swelling, no enlargement left testes. No tenderness, no swelling, no enlargement right testes. Normal location left testes. Normal location right testes. No mass, no cyst, no  varicocele, no hydrocele left testes. No mass, no cyst, no varicocele, no hydrocele right testes.  Urethral Meatus: Normal size. No lesion, no wart, no discharge, no polyp. Normal location.  Penis: Circumcised, no warts, no cracks. No dorsal Peyronie's plaques, no left corporal Peyronie's plaques, no right corporal Peyronie's plaques, no scarring, no warts. No balanitis, no meatal stenosis.   MULTI-SYSTEM PHYSICAL EXAMINATION:    Constitutional: Well-nourished. No physical deformities. Normally developed. Good grooming.  Respiratory: Normal breath sounds. No labored breathing, no use of accessory muscles.   Cardiovascular: Regular rate and rhythm. No murmur, no gallop.   Gastrointestinal: No hernia.     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 He has had a prior orchiopexy for torsion. I explained what the spermatocele is and discussed observation vs intervention with surgical removal. I reviewed the risks of bleeding, infection, testicular injury, recurrence, impact on fertility, ongoing pain, thrombotic events and anesthetic complications. He will think about it for now and I will have him return in 3 months for reexamination since it was a new finding.    PLAN:           Schedule Labs: 3 Months - Urinalysis  Return Visit/Planned Activity: Next Available Appointment - Schedule Surgery  Procedure: Unspecified Date - Removal of spermatocele - [phone], left Notes: Next available          Document Letter(s):  Created for Patient: Clinical Summary         Notes:   CC: Dr. Iona Beard.        Next Appointment:      Next Appointment: 03/16/2022 08:45 AM    Appointment Type: Office Visit Established Patient    Location: [facility] [phone]     Provider: [name], M.D.    Reason for Visit: 3 mo ov

## 2022-01-30 NOTE — Interval H&P Note (Signed)
History and Physical Interval Note:  He has had no changes in his history and no questions.   01/30/2022 10:46 AM  Randy Harrison  has presented today for surgery, with the diagnosis of LEFT SPERMATOCELE.  The various methods of treatment have been discussed with the patient and family. After consideration of risks, benefits and other options for treatment, the patient has consented to  Procedure(s) with comments: LEFT SPERMATOCELECTOMY (Left) - 1 HR FOR CASE as a surgical intervention.  The patient's history has been reviewed, patient examined, no change in status, stable for surgery.  I have reviewed the patient's chart and labs.  Questions were answered to the patient's satisfaction.     [name]

## 2022-01-30 NOTE — Op Note (Signed)
Procedure: Left spermatocelectomy.  Preop diagnosis: Left spermatocele.  Postop diagnosis: Same.  Surgeon: Dr. [name].  Anesthesia: General and local.  Drains: None.  Specimen: Spermatocele sac.  EBL: None.  Complications: None.  Indications: The patient is a 62 year old male with a 3 cm left spermatocele that is bothersome and he wants removed.  Procedure: He was taken the operating room was given Ancef.  A general anesthetic was induced.  He was left the supine position and fitted with PAS hose.  His scrotum was clipped.  He was prepped with Betadine solution and draped in usual sterile fashion.  The anterior scrotum was infiltrated with 3 mL of quarter percent Marcaine and an additional 3 mm was used for a cord block on the left.  A left anterior oblique scrotal incision was made with a knife and then carried through the dartos fascia into the tunica vaginalis with the Bovie.  The testicle was then delivered from the tunica vaginalis and the spermatocele was noted to be approximately 2 x 3 to 4 cm in size along the length of the epididymis.  The spermatocele and epididymis were dissected away from the testicle with care taken to avoid the testicular vascular supply.  The rete testis area of attachment to the epididymis was oversewn with a running 3-0 chromic suture once the epididymis had been released and the tail of the epididymis was dissected to the convoluted portion of the vas deferens which was then clamped and the spermatocele and epididymis were removed.  The vas and epididymal vessels were then ligated with a 3-0 chromic suture ligature.  The wound was inspected for hemostasis and a couple small bleeders on the edge of the tunica vaginalis were fulgurated.  An additional 3 mL of quarter percent Marcaine was instilled as a cord block and then the testicle was returned to the scrotum.  The dartos was then closed using a running 3-0 chromic suture and the skin was closed using a  running vertical mattress 3-0 chromic suture.  A dressing of 4 x 4's and a fluffed Kerlix was applied.  A scrotal support was placed.  His anesthetic was reversed and he was moved to recovery in stable condition.  There were no complications.

## 2022-01-30 NOTE — Anesthesia Preprocedure Evaluation (Addendum)
Anesthesia Evaluation  Patient identified by MRN, date of birth, ID band Patient awake    Reviewed: Allergy & Precautions, NPO status , Patient's Chart, lab work & pertinent test results  History of Anesthesia Complications Negative for: history of anesthetic complications  Airway Mallampati: II  TM Distance: >3 FB Neck ROM: Full    Dental  (+) Dental Advisory Given   Pulmonary neg pulmonary ROS   Pulmonary exam normal        Cardiovascular hypertension, Pt. on medications Normal cardiovascular exam+ dysrhythmias (PVCs)      Neuro/Psych negative neurological ROS  negative psych ROS   GI/Hepatic Neg liver ROS,GERD  Controlled,,  Endo/Other   Thyroid adenoma Obesity   Renal/GU negative Renal ROS     Musculoskeletal  (+) Arthritis ,    Abdominal   Peds  Hematology negative hematology ROS (+)   Anesthesia Other Findings   Reproductive/Obstetrics                             Anesthesia Physical Anesthesia Plan  ASA: 2  Anesthesia Plan: General   Post-op Pain Management: Tylenol PO (pre-op)*   Induction: Intravenous  PONV Risk Score and Plan: 2 and Treatment may vary due to age or medical condition, Ondansetron, Dexamethasone and Midazolam  Airway Management Planned: LMA  Additional Equipment: None  Intra-op Plan:   Post-operative Plan: Extubation in OR  Informed Consent: I have reviewed the patients History and Physical, chart, labs and discussed the procedure including the risks, benefits and alternatives for the proposed anesthesia with the patient or authorized representative who has indicated his/her understanding and acceptance.     Dental advisory given  Plan Discussed with: CRNA and Anesthesiologist  Anesthesia Plan Comments:        Anesthesia Quick Evaluation

## 2022-01-30 NOTE — Transfer of Care (Signed)
Immediate Anesthesia Transfer of Care Note  Patient: Randy Harrison  Procedure(s) Performed: LEFT SPERMATOCELECTOMY (Left: Scrotum)  Patient Location: PACU  Anesthesia Type:General  Level of Consciousness: drowsy and responds to stimulation  Airway & Oxygen Therapy: Patient Spontanous Breathing and Patient connected to face mask oxygen  Post-op Assessment: Report given to RN and Post -op Vital signs reviewed and stable  Post vital signs: Reviewed and stable  Last Vitals:  Vitals Value Taken Time  BP 126/77 01/30/22 1155  Temp    Pulse 69 01/30/22 1158  Resp 14 01/30/22 1158  SpO2 94 % 01/30/22 1158  Vitals shown include unvalidated device data.  Last Pain:  Vitals:   01/30/22 0920  TempSrc: Oral  PainSc: 0-No pain      Patients Stated Pain Goal: 5 (10/25/09 [id number])  Complications: No notable events documented.

## 2022-01-30 NOTE — Anesthesia Procedure Notes (Signed)
Procedure Name: LMA Insertion Date/Time: 01/30/2022 11:07 AM  Performed by: Rogers Blocker, CRNAPre-anesthesia Checklist: Patient identified, Emergency Drugs available, Suction available and Patient being monitored Patient Re-evaluated:Patient Re-evaluated prior to induction Oxygen Delivery Method: Circle System Utilized Preoxygenation: Pre-oxygenation with 100% oxygen Induction Type: IV induction Ventilation: Mask ventilation without difficulty LMA: LMA inserted LMA Size: 5.0 Number of attempts: 1 Airway Equipment and Method: Bite block Placement Confirmation: positive ETCO2 Tube secured with: Tape Dental Injury: Teeth and Oropharynx as per pre-operative assessment

## 2022-01-30 NOTE — Discharge Instructions (Addendum)
HOME CARE INSTRUCTIONS FOR SCROTAL PROCEDURES  Wound Care & Hygiene: You may apply an ice bag to the scrotum for the first 24 hours.  This may help decrease swelling and soreness.  You may have a dressing held in place by an athletic supporter.  You may remove the dressing in 24 hours and shower in 48 hours.  Continue to use the athletic supporter or tight briefs for at least a week. Activity: Rest today - not necessarily flat bed rest.  Just take it easy.  You should not do strenuous activities until your follow-up visit with your doctor.  You may resume light activity in 48 hours.  Return to Work:  Your doctor will advise you of this depending on the type of work you do  Diet: Drink liquids or eat a light diet this evening.  You may resume a regular diet tomorrow.  General Expectations: You may have a small amount of bleeding.  The scrotum may be swollen or bruised for about a week.  Call your Doctor if these occur:  -persistent or heavy bleeding  -temperature of 101 degrees or more  -severe pain, not relieved by your pain medication       No acetaminophen/Tylenol until after 3:40 pm today if needed.     Post Anesthesia Home Care Instructions  Activity: Get plenty of rest for the remainder of the day. A responsible individual must stay with you for 24 hours following the procedure.  For the next 24 hours, DO NOT: -Drive a car -Paediatric nurse -Drink alcoholic beverages -Take any medication unless instructed by your physician -Make any legal decisions or sign important papers.  Meals: Start with liquid foods such as gelatin or soup. Progress to regular foods as tolerated. Avoid greasy, spicy, heavy foods. If nausea and/or vomiting occur, drink only clear liquids until the nausea and/or vomiting subsides. Call your physician if vomiting continues.  Special Instructions/Symptoms: Your throat may feel dry or sore from the anesthesia or the breathing tube placed in your  throat during surgery. If this causes discomfort, gargle with warm salt water. The discomfort should disappear within 24 hours.

## 2022-02-01 NOTE — Anesthesia Postprocedure Evaluation (Signed)
Anesthesia Post Note  Patient: Randy Harrison  Procedure(s) Performed: LEFT SPERMATOCELECTOMY (Left: Scrotum)     Patient location during evaluation: PACU Anesthesia Type: General Level of consciousness: awake and alert Pain management: pain level controlled Vital Signs Assessment: post-procedure vital signs reviewed and stable Respiratory status: spontaneous breathing, nonlabored ventilation and respiratory function stable Cardiovascular status: stable and blood pressure returned to baseline Anesthetic complications: no  No notable events documented.  Last Vitals:  Vitals:   01/30/22 1230 01/30/22 1300  BP: (!) 128/91 (!) 145/99  Pulse: 67 67  Resp: 14 16  Temp:  36.6 C  SpO2: 95% 96%    Last Pain:  Vitals:   01/30/22 1300  TempSrc:   PainSc: [place]

## 2022-02-02 ENCOUNTER — Encounter (HOSPITAL_BASED_OUTPATIENT_CLINIC_OR_DEPARTMENT_OTHER): Payer: Self-pay | Admitting: Urology

## 2022-02-02 LAB — SURGICAL PATHOLOGY
